# Patient Record
Sex: Male | Born: 1980 | Race: White | Hispanic: No | Marital: Married | Smoking: Never smoker
Health system: Southern US, Community
[De-identification: ages and names within clinical notes are randomized; demographics above are authoritative.]

## PROBLEM LIST (undated history)

## (undated) DIAGNOSIS — J45909 Unspecified asthma, uncomplicated: Secondary | ICD-10-CM

## (undated) DIAGNOSIS — G43909 Migraine, unspecified, not intractable, without status migrainosus: Secondary | ICD-10-CM

## (undated) DIAGNOSIS — N200 Calculus of kidney: Secondary | ICD-10-CM

## (undated) HISTORY — PX: ADENOIDECTOMY: SUR15

## (undated) HISTORY — PX: WISDOM TOOTH EXTRACTION: SHX21

## (undated) HISTORY — PX: TYMPANOSTOMY TUBE PLACEMENT: SHX32

## (undated) HISTORY — PX: TONSILLECTOMY: SUR1361

## (undated) HISTORY — DX: Migraine, unspecified, not intractable, without status migrainosus: G43.909

## (undated) HISTORY — PX: OTHER SURGICAL HISTORY: SHX169

---

## 2005-11-16 ENCOUNTER — Emergency Department: Payer: Self-pay | Admitting: Emergency Medicine

## 2006-02-15 ENCOUNTER — Emergency Department: Payer: Self-pay | Admitting: Emergency Medicine

## 2006-02-15 ENCOUNTER — Other Ambulatory Visit: Payer: Self-pay

## 2006-12-30 ENCOUNTER — Emergency Department: Payer: Self-pay | Admitting: Emergency Medicine

## 2007-03-01 ENCOUNTER — Emergency Department: Payer: Self-pay | Admitting: Emergency Medicine

## 2007-03-10 ENCOUNTER — Emergency Department: Payer: Self-pay | Admitting: Emergency Medicine

## 2007-07-31 ENCOUNTER — Emergency Department: Payer: Self-pay | Admitting: Emergency Medicine

## 2007-07-31 ENCOUNTER — Other Ambulatory Visit: Payer: Self-pay

## 2007-12-01 ENCOUNTER — Emergency Department: Payer: Self-pay | Admitting: Emergency Medicine

## 2007-12-02 ENCOUNTER — Inpatient Hospital Stay (HOSPITAL_COMMUNITY): Admission: EM | Admit: 2007-12-02 | Discharge: 2007-12-02 | Payer: Self-pay | Admitting: Emergency Medicine

## 2007-12-11 ENCOUNTER — Emergency Department: Payer: Self-pay | Admitting: Emergency Medicine

## 2008-09-24 ENCOUNTER — Emergency Department: Payer: Self-pay | Admitting: Emergency Medicine

## 2009-01-22 ENCOUNTER — Emergency Department: Payer: Self-pay | Admitting: Emergency Medicine

## 2009-08-17 ENCOUNTER — Inpatient Hospital Stay: Payer: Self-pay | Admitting: Internal Medicine

## 2009-10-14 ENCOUNTER — Inpatient Hospital Stay: Payer: Self-pay | Admitting: Internal Medicine

## 2011-05-10 LAB — DIFFERENTIAL
Eosinophils Absolute: 0.5
Lymphs Abs: 2.8
Monocytes Absolute: 1.2 — ABNORMAL HIGH
Monocytes Relative: 11
Neutrophils Relative %: 57

## 2011-05-10 LAB — CBC
HCT: 43.7
Hemoglobin: 14.5
MCHC: 33.3
RBC: 5.27
RDW: 14.1

## 2011-05-10 LAB — POCT I-STAT, CHEM 8
Calcium, Ion: 1.18
Chloride: 111
Creatinine, Ser: 1.3
Glucose, Bld: 97
Hemoglobin: 15
Potassium: 3.8

## 2011-05-10 LAB — URINALYSIS, ROUTINE W REFLEX MICROSCOPIC
Hgb urine dipstick: NEGATIVE
Ketones, ur: NEGATIVE
Specific Gravity, Urine: 1.028
pH: 5

## 2013-03-17 ENCOUNTER — Emergency Department: Payer: Self-pay | Admitting: Emergency Medicine

## 2013-05-30 ENCOUNTER — Emergency Department: Payer: Self-pay | Admitting: Emergency Medicine

## 2013-11-11 ENCOUNTER — Emergency Department: Payer: Self-pay | Admitting: Emergency Medicine

## 2014-02-18 ENCOUNTER — Emergency Department: Payer: Self-pay | Admitting: Emergency Medicine

## 2014-02-18 LAB — COMPREHENSIVE METABOLIC PANEL
ALBUMIN: 3.9 g/dL (ref 3.4–5.0)
ALK PHOS: 63 U/L
ALT: 57 U/L (ref 12–78)
ANION GAP: 8 (ref 7–16)
AST: 35 U/L (ref 15–37)
BUN: 16 mg/dL (ref 7–18)
Bilirubin,Total: 0.3 mg/dL (ref 0.2–1.0)
CHLORIDE: 107 mmol/L (ref 98–107)
CREATININE: 1.23 mg/dL (ref 0.60–1.30)
Calcium, Total: 9 mg/dL (ref 8.5–10.1)
Co2: 25 mmol/L (ref 21–32)
EGFR (African American): >60
Glucose: 113 mg/dL — ABNORMAL HIGH (ref 65–99)
Osmolality: 281 (ref 275–301)
POTASSIUM: 3.7 mmol/L (ref 3.5–5.1)
SODIUM: 140 mmol/L (ref 136–145)
Total Protein: 7.8 g/dL (ref 6.4–8.2)

## 2014-02-18 LAB — CBC WITH DIFFERENTIAL/PLATELET
Basophil #: 0.1 10*3/uL (ref 0.0–0.1)
Basophil %: 1.1 %
EOS ABS: 0.2 10*3/uL (ref 0.0–0.7)
Eosinophil %: 2.9 %
HCT: 47.1 % (ref 40.0–52.0)
HGB: 15.4 g/dL (ref 13.0–18.0)
LYMPHS PCT: 42.5 %
Lymphocyte #: 3.6 10*3/uL (ref 1.0–3.6)
MCH: 27.6 pg (ref 26.0–34.0)
MCHC: 32.6 g/dL (ref 32.0–36.0)
MCV: 85 fL (ref 80–100)
MONO ABS: 0.9 x10 3/mm (ref 0.2–1.0)
MONOS PCT: 10.4 %
NEUTROS ABS: 3.6 10*3/uL (ref 1.4–6.5)
NEUTROS PCT: 43.1 %
PLATELETS: 200 10*3/uL (ref 150–440)
RBC: 5.58 10*6/uL (ref 4.40–5.90)
RDW: 13.9 % (ref 11.5–14.5)
WBC: 8.5 10*3/uL (ref 3.8–10.6)

## 2014-06-28 ENCOUNTER — Emergency Department: Payer: Self-pay | Admitting: Emergency Medicine

## 2014-10-04 ENCOUNTER — Emergency Department: Payer: Self-pay | Admitting: Emergency Medicine

## 2014-11-27 ENCOUNTER — Emergency Department
Admit: 2014-11-27 | Disposition: A | Discharge status: Home / Self Care | Payer: Self-pay | Admitting: Emergency Medicine

## 2014-11-27 LAB — COMPREHENSIVE METABOLIC PANEL
ALT: 39 U/L
AST: 30 U/L
Albumin: 4.9 g/dL
Alkaline Phosphatase: 67 U/L
Anion Gap: 11 (ref 7–16)
BILIRUBIN TOTAL: 0.9 mg/dL
BUN: 17 mg/dL
CALCIUM: 9.8 mg/dL
CHLORIDE: 104 mmol/L
CO2: 25 mmol/L
Creatinine: 1.1 mg/dL
EGFR (African American): >60
Glucose: 100 mg/dL — ABNORMAL HIGH
Potassium: 4 mmol/L
Sodium: 140 mmol/L
Total Protein: 8.3 g/dL — ABNORMAL HIGH

## 2014-11-27 LAB — CBC
HCT: 49.5 % (ref 40.0–52.0)
HGB: 16.5 g/dL (ref 13.0–18.0)
MCH: 27.7 pg (ref 26.0–34.0)
MCHC: 33.3 g/dL (ref 32.0–36.0)
MCV: 83 fL (ref 80–100)
Platelet: 153 10*3/uL (ref 150–440)
RBC: 5.94 10*6/uL — ABNORMAL HIGH (ref 4.40–5.90)
RDW: 13.9 % (ref 11.5–14.5)
WBC: 8.3 10*3/uL (ref 3.8–10.6)

## 2014-11-27 LAB — ED INFLUENZA
INFLAPCR: NEGATIVE
Influenza B By PCR: NEGATIVE

## 2014-11-27 LAB — URINALYSIS, COMPLETE
Bacteria: NONE SEEN
Bilirubin,UR: NEGATIVE
Blood: NEGATIVE
Glucose,UR: NEGATIVE mg/dL (ref 0–75)
Ketone: NEGATIVE
LEUKOCYTE ESTERASE: NEGATIVE
NITRITE: NEGATIVE
PROTEIN: NEGATIVE
Ph: 7 (ref 4.5–8.0)
RBC,UR: NONE SEEN /HPF (ref 0–5)
SPECIFIC GRAVITY: 1.023 (ref 1.003–1.030)
Squamous Epithelial: NONE SEEN
WBC UR: NONE SEEN /HPF (ref 0–5)

## 2014-11-27 LAB — LIPASE, BLOOD: LIPASE: 35 U/L

## 2015-01-22 ENCOUNTER — Emergency Department
Admission: EM | Admit: 2015-01-22 | Discharge: 2015-01-22 | Disposition: A | Discharge status: Home / Self Care | Payer: BLUE CROSS/BLUE SHIELD | Attending: Emergency Medicine | Admitting: Emergency Medicine

## 2015-01-22 DIAGNOSIS — K0889 Other specified disorders of teeth and supporting structures: Secondary | ICD-10-CM

## 2015-01-22 DIAGNOSIS — Z880 Allergy status to penicillin: Secondary | ICD-10-CM | POA: Insufficient documentation

## 2015-01-22 DIAGNOSIS — K088 Other specified disorders of teeth and supporting structures: Secondary | ICD-10-CM | POA: Diagnosis present

## 2015-01-22 MED ORDER — OXYCODONE-ACETAMINOPHEN 7.5-325 MG PO TABS: 1.0000 | ORAL_TABLET | Freq: Four times a day (QID) | ORAL | Status: DC | PRN

## 2015-01-22 MED ORDER — OXYCODONE-ACETAMINOPHEN 5-325 MG PO TABS
ORAL_TABLET | ORAL | Status: AC
  Administered 2015-01-22: 2 via ORAL
  Filled 2015-01-22: qty 2

## 2015-01-22 MED ORDER — OXYCODONE-ACETAMINOPHEN 5-325 MG PO TABS
2.0000 | ORAL_TABLET | Freq: Once | ORAL | Status: AC
  Administered 2015-01-22: 2 via ORAL

## 2015-01-22 NOTE — ED Notes (Signed)
Pt had filling 3 weeks ago has been treated for abscess to area, states not better and increased swelling.  Saw pmd today and put on clindamycin but here for pain control.

## 2015-01-22 NOTE — Discharge Instructions (Signed)
Follow up with R.Beckie Salts, DDS

## 2015-01-22 NOTE — ED Notes (Signed)
Patient with no complaints at this time. Respirations even and unlabored. Skin warm/dry. Discharge instructions reviewed with patient at this time. Patient given opportunity to voice concerns/ask questions. Patient discharged at this time and left Emergency Department with steady gait.   

## 2015-01-22 NOTE — ED Provider Notes (Signed)
[facility] Emergency Department Provider Note  ____________________________________________  Time seen: Approximately 8:59 PM  I have reviewed the triage vital signs and the nursing notes.   HISTORY  Chief Complaint Dental Pain    HPI Brian Contreras is a 34 y.o. male patient here today for dental pain secondary to a filling and abscess was treated 2 weeks ago. He stated he went to the dentist today and has a prescription for clindamycin stated the pain did not increased after he left his office. Patient now rates his pain as 7/10 describe it as sharp. Patient has prescription on hand for clindamycin only.  No past medical history on file.  There are no active problems to display for this patient.   No past surgical history on file.  Current Outpatient Rx  Name  Route  Sig  Dispense  Refill  . oxyCODONE-acetaminophen (PERCOCET) 7.5-325 MG per tablet   Oral   Take 1 tablet by mouth every 6 (six) hours as needed for severe pain.   12 tablet   0     Allergies Amoxicillin; Ceclor; Penicillins; and Sulfa antibiotics  No family history on file.  Social History History  Substance Use Topics  . Smoking status: Not on file  . Smokeless tobacco: Not on file  . Alcohol Use: Not on file    Review of Systems Constitutional: No fever/chills Eyes: No visual changes. ENT: Dental pain Cardiovascular: Denies chest pain. Respiratory: Denies shortness of breath. Gastrointestinal: No abdominal pain.  No nausea, no vomiting.  No diarrhea.  No constipation. Genitourinary: Negative for dysuria. Musculoskeletal: Negative for back pain. Skin: Negative for rash. Neurological: Negative for headaches, focal weakness or numbness.  10-point ROS otherwise negative.  ____________________________________________   PHYSICAL EXAM:  VITAL SIGNS: ED Triage Vitals  Enc Vitals Group     BP 01/22/15 2048 155/106 mmHg     Pulse Rate 01/22/15 2048 103     Resp  01/22/15 2048 18     Temp 01/22/15 2048 98.7 F (37.1 C)     Temp Source 01/22/15 2048 Oral     SpO2 01/22/15 2048 97 %     Weight 01/22/15 2048 265 lb (120.203 kg)     Height 01/22/15 2048  (1.981 m)     Head Cir --      Peak Flow --      Pain Score 01/22/15 2048 7     Pain Loc --      Pain Edu? --      Excl. in GC? --    Constitutional: Alert and oriented. Well appearing and in no acute distress. Eyes: Conjunctivae are normal. PERRL. EOMI. Head: Atraumatic. Nose: No congestion/rhinnorhea. Mouth/Throat: Mucous membranes are moist.  Oropharynx non-erythematous. Edema and erythema at tooth #29. Neck: No stridor.  No deformity free nuchal range of motion nontender palpation. Hematological/Lymphatic/Immunilogical: No cervical lymphadenopathy. Cardiovascular: Normal rate, regular rhythm. Grossly normal heart sounds.  Good peripheral circulation. Respiratory: Normal respiratory effort.  No retractions. Lungs CTAB. Gastrointestinal: Soft and nontender. No distention. No abdominal bruits. No CVA tenderness. Musculoskeletal: No lower extremity tenderness nor edema.  No joint effusions. Neurologic:  Normal speech and language. No gross focal neurologic deficits are appreciated. Speech is normal. No gait instability. Skin:  Skin is warm, dry and intact. No rash noted. Psychiatric: Mood and affect are normal. Speech and behavior are normal.  ____________________________________________   LABS (all labs ordered are listed, but only abnormal results are displayed)  Labs Reviewed -  No data to display ____________________________________________  EKG   ____________________________________________  RADIOLOGY   ____________________________________________   PROCEDURES  Procedure(s) performed: None  Critical Care performed: No  ____________________________________________   INITIAL IMPRESSION / ASSESSMENT AND PLAN / ED COURSE  Pertinent labs & imaging results that were  available during my care of the patient were reviewed by me and considered in my medical decision making (see chart for details).  Dental pain secondary to abscess. ____________________________________________   FINAL CLINICAL IMPRESSION(S) / ED DIAGNOSES  Final diagnoses:  Pain, dental      Joni Reining, PA-C 01/22/15 2104  Governor Rooks, MD 01/22/15 2250

## 2015-02-05 ENCOUNTER — Encounter: Payer: Self-pay | Admitting: *Deleted

## 2015-02-05 ENCOUNTER — Emergency Department
Admission: EM | Admit: 2015-02-05 | Discharge: 2015-02-05 | Disposition: A | Discharge status: Home / Self Care | Payer: BLUE CROSS/BLUE SHIELD | Attending: Emergency Medicine | Admitting: Emergency Medicine

## 2015-02-05 ENCOUNTER — Emergency Department: Payer: BLUE CROSS/BLUE SHIELD

## 2015-02-05 DIAGNOSIS — Y998 Other external cause status: Secondary | ICD-10-CM | POA: Diagnosis not present

## 2015-02-05 DIAGNOSIS — W109XXA Fall (on) (from) unspecified stairs and steps, initial encounter: Secondary | ICD-10-CM | POA: Insufficient documentation

## 2015-02-05 DIAGNOSIS — S8391XA Sprain of unspecified site of right knee, initial encounter: Secondary | ICD-10-CM | POA: Insufficient documentation

## 2015-02-05 DIAGNOSIS — Z880 Allergy status to penicillin: Secondary | ICD-10-CM | POA: Insufficient documentation

## 2015-02-05 DIAGNOSIS — Z720 Tobacco use: Secondary | ICD-10-CM | POA: Diagnosis not present

## 2015-02-05 DIAGNOSIS — S8991XA Unspecified injury of right lower leg, initial encounter: Secondary | ICD-10-CM | POA: Diagnosis present

## 2015-02-05 DIAGNOSIS — Y92009 Unspecified place in unspecified non-institutional (private) residence as the place of occurrence of the external cause: Secondary | ICD-10-CM | POA: Insufficient documentation

## 2015-02-05 DIAGNOSIS — Y9389 Activity, other specified: Secondary | ICD-10-CM | POA: Diagnosis not present

## 2015-02-05 DIAGNOSIS — S93601A Unspecified sprain of right foot, initial encounter: Secondary | ICD-10-CM | POA: Diagnosis not present

## 2015-02-05 HISTORY — DX: Unspecified asthma, uncomplicated: J45.909

## 2015-02-05 MED ORDER — TRAMADOL HCL 50 MG PO TABS: 50.0000 mg | ORAL_TABLET | Freq: Four times a day (QID) | ORAL | Status: DC | PRN

## 2015-02-05 MED ORDER — IBUPROFEN 800 MG PO TABS
800.0000 mg | ORAL_TABLET | Freq: Once | ORAL | Status: AC
  Administered 2015-02-05: 800 mg via ORAL

## 2015-02-05 MED ORDER — TRAMADOL HCL 50 MG PO TABS
ORAL_TABLET | ORAL | Status: AC
  Administered 2015-02-05: 50 mg via ORAL
  Filled 2015-02-05: qty 1

## 2015-02-05 MED ORDER — IBUPROFEN 800 MG PO TABS: 800.0000 mg | ORAL_TABLET | Freq: Three times a day (TID) | ORAL | Status: DC | PRN

## 2015-02-05 MED ORDER — IBUPROFEN 800 MG PO TABS
ORAL_TABLET | ORAL | Status: AC
  Administered 2015-02-05: 800 mg via ORAL
  Filled 2015-02-05: qty 1

## 2015-02-05 MED ORDER — TRAMADOL HCL 50 MG PO TABS
50.0000 mg | ORAL_TABLET | Freq: Once | ORAL | Status: AC
  Administered 2015-02-05: 50 mg via ORAL

## 2015-02-05 NOTE — ED Provider Notes (Signed)
[facility] Emergency Department Provider Note  ____________________________________________  Time seen: Approximately 4:20 PM  I have reviewed the triage vital signs and the nursing notes.   HISTORY  Chief Complaint Fall    HPI Brian Contreras is a 34 y.o. male patient complaining or right knee and right foot pain secondary to a fall last night. He states walking down stairs in his home and and slipped causing a hyperextension of his knee and right foot. Patient state increased pain with flexion of the knee and weightbearing. Patient also complaining of increased pain with dorsiflexion of the foot. Patient rates the pain as a 5/10 and described as sharp. Patient states only able to ambulate with support.   Past Medical History  Diagnosis Date  . Asthma     There are no active problems to display for this patient.   History reviewed. No pertinent past surgical history.  Current Outpatient Rx  Name  Route  Sig  Dispense  Refill  . ibuprofen (ADVIL,MOTRIN) 800 MG tablet   Oral   Take 1 tablet (800 mg total) by mouth every 8 (eight) hours as needed for moderate pain.   15 tablet   0   . oxyCODONE-acetaminophen (PERCOCET) 7.5-325 MG per tablet   Oral   Take 1 tablet by mouth every 6 (six) hours as needed for severe pain.   12 tablet   0   . traMADol (ULTRAM) 50 MG tablet   Oral   Take 1 tablet (50 mg total) by mouth every 6 (six) hours as needed for moderate pain.   12 tablet   0     Allergies Amoxicillin; Ceclor; Penicillins; and Sulfa antibiotics  No family history on file.  Social History History  Substance Use Topics  . Smoking status: Current Some Day Smoker  . Smokeless tobacco: Not on file  . Alcohol Use: Yes     Comment: occasional    Review of Systems Constitutional: No fever/chills Eyes: No visual changes. ENT: No sore throat. Cardiovascular: Denies chest pain. Respiratory: Denies shortness of  breath. Gastrointestinal: No abdominal pain.  No nausea, no vomiting.  No diarrhea.  No constipation. Genitourinary: Negative for dysuria. Musculoskeletal: Right knee and right foot pain Skin: Negative for rash. Neurological: Negative for headaches, focal weakness or numbness. 10-point ROS otherwise negative.  ____________________________________________   PHYSICAL EXAM:  VITAL SIGNS: ED Triage Vitals  Enc Vitals Group     BP 02/05/15 1554 129/89 mmHg     Pulse Rate 02/05/15 1554 92     Resp --      Temp 02/05/15 1554 97.6 F (36.4 C)     Temp Source 02/05/15 1554 Oral     SpO2 02/05/15 1554 97 %     Weight 02/05/15 1554 265 lb (120.203 kg)     Height 02/05/15 1554  (1.981 m)     Head Cir --      Peak Flow --      Pain Score 02/05/15 1555 5     Pain Loc --      Pain Edu? --      Excl. in GC? --     Constitutional: Alert and oriented. Well appearing and in no acute distress. Eyes: Conjunctivae are normal. PERRL. EOMI. Head: Atraumatic. Nose: No congestion/rhinnorhea. Mouth/Throat: Mucous membranes are moist.  Oropharynx non-erythematous. Neck: No stridor.  No deformity for nuchal range of motion nontender palpation. Hematological/Lymphatic/Immunilogical: No cervical lymphadenopathy. Cardiovascular: Normal rate, regular rhythm. Grossly normal heart sounds.  Good peripheral circulation. Respiratory: Normal respiratory effort.  No retractions. Lungs CTAB. Gastrointestinal: Soft and nontender. No distention. No abdominal bruits. No CVA tenderness. Musculoskeletal: No lower extremity tenderness nor edema.  No joint effusions. Neurologic:  Normal speech and language. No gross focal neurologic deficits are appreciated. Speech is normal. No gait instability. Skin:  Skin is warm, dry and intact. No rash noted. Psychiatric: Mood and affect are normal. Speech and behavior are normal.  ____________________________________________   LABS (all labs ordered are listed, but only  abnormal results are displayed)  Labs Reviewed - No data to display ____________________________________________  EKG   ____________________________________________  RADIOLOGY  .I, Joni Reining, personally viewed and evaluated these images as part of my medical decision making.   ____No acute findings of the right knee and right foot. ________________________________________   PROCEDURES  Procedure(s) performed: None  Critical Care performed: No  ____________________________________________   INITIAL IMPRESSION / ASSESSMENT AND PLAN / ED COURSE  Pertinent labs & imaging results that were available during my care of the patient were reviewed by me and considered in my medical decision making (see chart for details).  Sprain right knee and foot.__Patient was placed in a knee immobilizer given crutches to help her ambulate for the next 2-3 days. Patient also be given a prescription for naproxen and tramadol. Patient also given a work excuse the next 2 days. Patient advised follow-up with [facility] if there is no improvement _or worsening of his complaint. _________________________________________   FINAL CLINICAL IMPRESSION(S) / ED DIAGNOSES  Final diagnoses:  Sprain of right knee, initial encounter  Sprain of right foot, initial encounter      Joni Reining, PA-C 02/05/15 1737  Myrna Blazer, MD 02/05/15 [id number]

## 2015-02-05 NOTE — ED Notes (Signed)
Pt alert and oriented X4, active, cooperative, pt in NAD. RR even and unlabored, color WNL.  Pt informed to return if any life threatening symptoms occur.   

## 2015-02-05 NOTE — ED Notes (Signed)
Pt missed a step fell and landed on right leg, pt complains of pain in right leg and right knee

## 2015-02-05 NOTE — ED Notes (Signed)
Fall last night and pain to right knee and right ankle. Wrapped on arrival. Pt comes with crutches

## 2015-03-19 ENCOUNTER — Emergency Department
Admission: EM | Admit: 2015-03-19 | Discharge: 2015-03-19 | Disposition: A | Discharge status: Home / Self Care | Payer: BLUE CROSS/BLUE SHIELD | Attending: Emergency Medicine | Admitting: Emergency Medicine

## 2015-03-19 ENCOUNTER — Emergency Department: Payer: BLUE CROSS/BLUE SHIELD

## 2015-03-19 ENCOUNTER — Encounter: Payer: Self-pay | Admitting: *Deleted

## 2015-03-19 DIAGNOSIS — S39012A Strain of muscle, fascia and tendon of lower back, initial encounter: Secondary | ICD-10-CM | POA: Diagnosis not present

## 2015-03-19 DIAGNOSIS — Y998 Other external cause status: Secondary | ICD-10-CM | POA: Insufficient documentation

## 2015-03-19 DIAGNOSIS — Y9389 Activity, other specified: Secondary | ICD-10-CM | POA: Diagnosis not present

## 2015-03-19 DIAGNOSIS — Z720 Tobacco use: Secondary | ICD-10-CM | POA: Diagnosis not present

## 2015-03-19 DIAGNOSIS — S199XXA Unspecified injury of neck, initial encounter: Secondary | ICD-10-CM | POA: Diagnosis present

## 2015-03-19 DIAGNOSIS — Y92410 Unspecified street and highway as the place of occurrence of the external cause: Secondary | ICD-10-CM | POA: Diagnosis not present

## 2015-03-19 DIAGNOSIS — Z880 Allergy status to penicillin: Secondary | ICD-10-CM | POA: Insufficient documentation

## 2015-03-19 DIAGNOSIS — S161XXA Strain of muscle, fascia and tendon at neck level, initial encounter: Secondary | ICD-10-CM | POA: Diagnosis not present

## 2015-03-19 MED ORDER — TRAMADOL HCL 50 MG PO TABS: 50.0000 mg | ORAL_TABLET | Freq: Four times a day (QID) | ORAL | Status: DC | PRN

## 2015-03-19 MED ORDER — IBUPROFEN 800 MG PO TABS: 800.0000 mg | ORAL_TABLET | Freq: Three times a day (TID) | ORAL | Status: DC | PRN

## 2015-03-19 MED ORDER — CYCLOBENZAPRINE HCL 10 MG PO TABS
5.0000 mg | ORAL_TABLET | Freq: Once | ORAL | Status: AC
  Administered 2015-03-19: 5 mg via ORAL
  Filled 2015-03-19: qty 1

## 2015-03-19 MED ORDER — TRAMADOL HCL 50 MG PO TABS
50.0000 mg | ORAL_TABLET | Freq: Once | ORAL | Status: AC
  Administered 2015-03-19: 50 mg via ORAL
  Filled 2015-03-19: qty 1

## 2015-03-19 MED ORDER — CYCLOBENZAPRINE HCL 10 MG PO TABS: 10.0000 mg | ORAL_TABLET | Freq: Three times a day (TID) | ORAL | Status: DC | PRN

## 2015-03-19 MED ORDER — IBUPROFEN 800 MG PO TABS
800.0000 mg | ORAL_TABLET | Freq: Once | ORAL | Status: AC
  Administered 2015-03-19: 800 mg via ORAL
  Filled 2015-03-19: qty 1

## 2015-03-19 NOTE — ED Notes (Signed)
PA at bedside.
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
## 2015-04-14 ENCOUNTER — Encounter: Payer: Self-pay | Admitting: Emergency Medicine

## 2015-04-14 ENCOUNTER — Emergency Department
Admission: EM | Admit: 2015-04-14 | Discharge: 2015-04-14 | Disposition: A | Discharge status: Home / Self Care | Payer: BLUE CROSS/BLUE SHIELD | Attending: Emergency Medicine | Admitting: Emergency Medicine

## 2015-04-14 DIAGNOSIS — Z880 Allergy status to penicillin: Secondary | ICD-10-CM | POA: Insufficient documentation

## 2015-04-14 DIAGNOSIS — R109 Unspecified abdominal pain: Secondary | ICD-10-CM | POA: Insufficient documentation

## 2015-04-14 DIAGNOSIS — Z720 Tobacco use: Secondary | ICD-10-CM | POA: Insufficient documentation

## 2015-04-14 DIAGNOSIS — R112 Nausea with vomiting, unspecified: Secondary | ICD-10-CM | POA: Diagnosis present

## 2015-04-14 DIAGNOSIS — R197 Diarrhea, unspecified: Secondary | ICD-10-CM | POA: Insufficient documentation

## 2015-04-14 LAB — URINALYSIS COMPLETE WITH MICROSCOPIC (ARMC ONLY)
Bacteria, UA: NONE SEEN
Bilirubin Urine: NEGATIVE
Glucose, UA: NEGATIVE mg/dL
HGB URINE DIPSTICK: NEGATIVE
Ketones, ur: NEGATIVE mg/dL
Leukocytes, UA: NEGATIVE
NITRITE: NEGATIVE
Protein, ur: NEGATIVE mg/dL
RBC / HPF: NONE SEEN RBC/hpf (ref 0–5)
SPECIFIC GRAVITY, URINE: 1.023 (ref 1.005–1.030)
SQUAMOUS EPITHELIAL / LPF: NONE SEEN
WBC, UA: NONE SEEN WBC/hpf (ref 0–5)
pH: 5 (ref 5.0–8.0)

## 2015-04-14 LAB — CBC
HEMATOCRIT: 46.8 % (ref 40.0–52.0)
Hemoglobin: 15.7 g/dL (ref 13.0–18.0)
MCH: 28.2 pg (ref 26.0–34.0)
MCHC: 33.6 g/dL (ref 32.0–36.0)
MCV: 84.1 fL (ref 80.0–100.0)
Platelets: 160 10*3/uL (ref 150–440)
RBC: 5.57 MIL/uL (ref 4.40–5.90)
RDW: 14.3 % (ref 11.5–14.5)
WBC: 5.2 10*3/uL (ref 3.8–10.6)

## 2015-04-14 LAB — COMPREHENSIVE METABOLIC PANEL
ALT: 44 U/L (ref 17–63)
AST: 36 U/L (ref 15–41)
Albumin: 4.4 g/dL (ref 3.5–5.0)
Alkaline Phosphatase: 69 U/L (ref 38–126)
Anion gap: 8 (ref 5–15)
BILIRUBIN TOTAL: 1.1 mg/dL (ref 0.3–1.2)
BUN: 13 mg/dL (ref 6–20)
CHLORIDE: 107 mmol/L (ref 101–111)
CO2: 27 mmol/L (ref 22–32)
Calcium: 9.3 mg/dL (ref 8.9–10.3)
Creatinine, Ser: 1.34 mg/dL — ABNORMAL HIGH (ref 0.61–1.24)
Glucose, Bld: 108 mg/dL — ABNORMAL HIGH (ref 65–99)
POTASSIUM: 3.7 mmol/L (ref 3.5–5.1)
Sodium: 142 mmol/L (ref 135–145)
TOTAL PROTEIN: 7.7 g/dL (ref 6.5–8.1)

## 2015-04-14 MED ORDER — METOCLOPRAMIDE HCL 5 MG PO TABS: 5.0000 mg | ORAL_TABLET | Freq: Three times a day (TID) | ORAL | Status: DC

## 2015-04-14 MED ORDER — METRONIDAZOLE 500 MG PO TABS: 500.0000 mg | ORAL_TABLET | Freq: Three times a day (TID) | ORAL | Status: AC

## 2015-04-14 MED ORDER — SODIUM CHLORIDE 0.9 % IV BOLUS (SEPSIS)
1000.0000 mL | Freq: Once | INTRAVENOUS | Status: AC
  Administered 2015-04-14: 1000 mL via INTRAVENOUS

## 2015-04-14 MED ORDER — ONDANSETRON HCL 4 MG/2ML IJ SOLN
4.0000 mg | Freq: Once | INTRAMUSCULAR | Status: AC
  Administered 2015-04-14: 4 mg via INTRAVENOUS
  Filled 2015-04-14: qty 2

## 2015-04-14 MED ORDER — ONDANSETRON 4 MG PO TBDP: 4.0000 mg | ORAL_TABLET | Freq: Three times a day (TID) | ORAL | Status: DC | PRN

## 2015-04-14 MED ORDER — METOCLOPRAMIDE HCL 5 MG/ML IJ SOLN
10.0000 mg | Freq: Once | INTRAMUSCULAR | Status: AC
  Administered 2015-04-14: 10 mg via INTRAVENOUS
  Filled 2015-04-14: qty 2

## 2015-04-14 MED ORDER — METRONIDAZOLE 500 MG PO TABS
500.0000 mg | ORAL_TABLET | Freq: Once | ORAL | Status: AC
  Administered 2015-04-14: 500 mg via ORAL
  Filled 2015-04-14: qty 1
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
## 2015-04-14 NOTE — ED Notes (Signed)
Patient to ED with report of vomiting and diarrhea, reports diarrhea for the last 5 days and vomiting for the last 24 hours.

## 2015-04-14 NOTE — Discharge Instructions (Signed)
Your blood tests overall look good. You have a normal white blood cell count and normal renal function with normal electrolytes. Your urine looks okay, without acute tones, which are usually present when the body is struggling with starvation or fuel supply issues. Take Zofran or Reglan for nausea. Take metronidazole as prescribed for possible intestinal infection. Follow-up with her regular doctor. Return to the emergency department if you feel worse, if you have a fever, or feel other urgent concerns.  Diarrhea Diarrhea is watery poop (stool). It can make you feel weak, tired, thirsty, or give you a dry mouth (signs of dehydration). Watery poop is a sign of another problem, most often an infection. It often lasts 2-3 days. It can last longer if it is a sign of something serious. Take care of yourself as told by your doctor. HOME CARE   Drink 1 cup (8 ounces) of fluid each time you have watery poop.  Do not drink the following fluids:  Those that contain simple sugars (fructose, glucose, galactose, lactose, sucrose, maltose).  Sports drinks.  Fruit juices.  Whole milk products.  Sodas.  Drinks with caffeine (coffee, tea, soda) or alcohol.  Oral rehydration solution may be used if the doctor says it is okay. You may make your own solution. Follow this recipe:   - teaspoon table salt.   teaspoon baking soda.   teaspoon salt substitute containing potassium chloride.  1 tablespoons sugar.  1 liter (34 ounces) of water.  Avoid the following foods:  High fiber foods, such as raw fruits and vegetables.  Nuts, seeds, and whole grain breads and cereals.   Those that are sweetened with sugar alcohols (xylitol, sorbitol, mannitol).  Try eating the following foods:  Starchy foods, such as rice, toast, pasta, low-sugar cereal, oatmeal, baked potatoes, crackers, and bagels.  Bananas.  Applesauce.  Eat probiotic-rich foods, such as yogurt and milk products that are  fermented.  Wash your hands well after each time you have watery poop.  Only take medicine as told by your doctor.  Take a warm bath to help lessen burning or pain from having watery poop. GET HELP RIGHT AWAY IF:   You cannot drink fluids without throwing up (vomiting).  You keep throwing up.  You have blood in your poop, or your poop looks black and tarry.  You do not pee (urinate) in 6-8 hours, or there is only a small amount of very dark pee.  You have belly (abdominal) pain that gets worse or stays in the same spot (localizes).  You are weak, dizzy, confused, or light-headed.  You have a very bad headache.  Your watery poop gets worse or does not get better.  You have a fever or lasting symptoms for more than 2-3 days.  You have a fever and your symptoms suddenly get worse. MAKE SURE YOU:   Understand these instructions.  Will watch your condition.  Will get help right away if you are not doing well or get worse. Document Released: 01/18/2008 Document Revised: 12/16/2013 Document Reviewed: 04/08/2012 [facility] Patient Information 2015 [place], [place]. This information is not intended to replace advice given to you by your health care provider. Make sure you discuss any questions you have with your health care provider.

## 2015-04-26 ENCOUNTER — Encounter: Payer: Self-pay | Admitting: Emergency Medicine

## 2015-04-26 ENCOUNTER — Emergency Department
Admission: EM | Admit: 2015-04-26 | Discharge: 2015-04-26 | Disposition: A | Discharge status: Home / Self Care | Payer: BLUE CROSS/BLUE SHIELD | Attending: Emergency Medicine | Admitting: Emergency Medicine

## 2015-04-26 DIAGNOSIS — Z792 Long term (current) use of antibiotics: Secondary | ICD-10-CM | POA: Insufficient documentation

## 2015-04-26 DIAGNOSIS — Z79899 Other long term (current) drug therapy: Secondary | ICD-10-CM | POA: Insufficient documentation

## 2015-04-26 DIAGNOSIS — Z720 Tobacco use: Secondary | ICD-10-CM | POA: Insufficient documentation

## 2015-04-26 DIAGNOSIS — M545 Low back pain: Secondary | ICD-10-CM | POA: Diagnosis present

## 2015-04-26 DIAGNOSIS — M62830 Muscle spasm of back: Secondary | ICD-10-CM | POA: Insufficient documentation

## 2015-04-26 DIAGNOSIS — Z880 Allergy status to penicillin: Secondary | ICD-10-CM | POA: Diagnosis not present

## 2015-04-26 MED ORDER — KETOROLAC TROMETHAMINE 60 MG/2ML IM SOLN
60.0000 mg | Freq: Once | INTRAMUSCULAR | Status: AC
  Administered 2015-04-26: 60 mg via INTRAMUSCULAR
  Filled 2015-04-26: qty 2

## 2015-04-26 MED ORDER — ORPHENADRINE CITRATE 30 MG/ML IJ SOLN
60.0000 mg | Freq: Two times a day (BID) | INTRAMUSCULAR | Status: DC
  Administered 2015-04-26: 60 mg via INTRAMUSCULAR
  Filled 2015-04-26: qty 2

## 2015-04-26 MED ORDER — METHOCARBAMOL 750 MG PO TABS: 1500.0000 mg | ORAL_TABLET | Freq: Four times a day (QID) | ORAL | Status: DC
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
## 2015-08-21 ENCOUNTER — Encounter: Payer: Self-pay | Admitting: Unknown Physician Specialty

## 2015-09-08 ENCOUNTER — Encounter: Payer: Self-pay | Admitting: Unknown Physician Specialty

## 2016-03-03 ENCOUNTER — Encounter: Payer: Self-pay | Admitting: Emergency Medicine

## 2016-03-03 ENCOUNTER — Emergency Department
Admission: EM | Admit: 2016-03-03 | Discharge: 2016-03-03 | Disposition: A | Discharge status: Home / Self Care | Payer: 59 | Attending: Emergency Medicine | Admitting: Emergency Medicine

## 2016-03-03 DIAGNOSIS — X58XXXA Exposure to other specified factors, initial encounter: Secondary | ICD-10-CM | POA: Insufficient documentation

## 2016-03-03 DIAGNOSIS — Y999 Unspecified external cause status: Secondary | ICD-10-CM | POA: Insufficient documentation

## 2016-03-03 DIAGNOSIS — K0381 Cracked tooth: Secondary | ICD-10-CM

## 2016-03-03 DIAGNOSIS — Y929 Unspecified place or not applicable: Secondary | ICD-10-CM | POA: Diagnosis not present

## 2016-03-03 DIAGNOSIS — S025XXA Fracture of tooth (traumatic), initial encounter for closed fracture: Secondary | ICD-10-CM | POA: Diagnosis not present

## 2016-03-03 DIAGNOSIS — J45909 Unspecified asthma, uncomplicated: Secondary | ICD-10-CM | POA: Insufficient documentation

## 2016-03-03 DIAGNOSIS — F17200 Nicotine dependence, unspecified, uncomplicated: Secondary | ICD-10-CM | POA: Insufficient documentation

## 2016-03-03 DIAGNOSIS — K029 Dental caries, unspecified: Secondary | ICD-10-CM

## 2016-03-03 DIAGNOSIS — Y939 Activity, unspecified: Secondary | ICD-10-CM | POA: Insufficient documentation

## 2016-03-03 DIAGNOSIS — Z79899 Other long term (current) drug therapy: Secondary | ICD-10-CM | POA: Insufficient documentation

## 2016-03-03 DIAGNOSIS — K0889 Other specified disorders of teeth and supporting structures: Secondary | ICD-10-CM | POA: Diagnosis present

## 2016-03-03 MED ORDER — LIDOCAINE-EPINEPHRINE 2 %-1:100000 IJ SOLN
1.7000 mL | Freq: Once | INTRAMUSCULAR | Status: AC
  Administered 2016-03-03: 1.7 mL
  Filled 2016-03-03: qty 1.7

## 2016-03-03 MED ORDER — TRAMADOL HCL 50 MG PO TABS: 50.0000 mg | ORAL_TABLET | Freq: Two times a day (BID) | ORAL | Status: DC

## 2016-03-03 MED ORDER — CLINDAMYCIN HCL 300 MG PO CAPS: 300.0000 mg | ORAL_CAPSULE | Freq: Three times a day (TID) | ORAL | Status: DC

## 2016-03-03 MED ORDER — CLINDAMYCIN HCL 150 MG PO CAPS
300.0000 mg | ORAL_CAPSULE | Freq: Once | ORAL | Status: AC
  Administered 2016-03-03: 300 mg via ORAL
  Filled 2016-03-03: qty 2

## 2016-03-03 NOTE — ED Notes (Signed)
States he has been having some dental work done .  But states he broke a tooth to right upper couple of days ago  Having increased pain with min swelling

## 2016-03-03 NOTE — ED Provider Notes (Signed)
[facility] Emergency Department Provider Note ____________________________________________  Time seen: 1952  I have reviewed the triage vital signs and the nursing notes.  HISTORY  Chief Complaint  Dental Pain  HPI Brian Contreras is a 35 y.o. male is to the ED for evaluation of acute dental pain to the right upper jaw after a recently fractured tooth. He describes of breaking of the right upper premolar about to 3 days prior. He does note underlying dental caries as the cause. He has been evaluated and treated over the last 6 months with serial extractions by Dr. Hedda Slade. He presents with stating he is not able to see the dental provider until next week due to financial constraints. He denies any current fevers, chills, sweats. He does note increased pain and throbbing to the right upper jaw.  Past Medical History  Diagnosis Date  . Asthma     There are no active problems to display for this patient.   Past Surgical History  Procedure Laterality Date  . Tympanostomy tube placement    . Tonsillectomy    . Arm surgery Bilateral     Current Outpatient Rx  Name  Route  Sig  Dispense  Refill  . clindamycin (CLEOCIN) 300 MG capsule   Oral   Take 1 capsule (300 mg total) by mouth 3 (three) times daily.   30 capsule   0   . cyclobenzaprine (FLEXERIL) 10 MG tablet   Oral   Take 1 tablet (10 mg total) by mouth every 8 (eight) hours as needed for muscle spasms.   15 tablet   0   . ibuprofen (ADVIL,MOTRIN) 800 MG tablet   Oral   Take 1 tablet (800 mg total) by mouth every 8 (eight) hours as needed for moderate pain.   15 tablet   0   . ibuprofen (ADVIL,MOTRIN) 800 MG tablet   Oral   Take 1 tablet (800 mg total) by mouth every 8 (eight) hours as needed for moderate pain.   15 tablet   0   . methocarbamol (ROBAXIN-750) 750 MG tablet   Oral   Take 2 tablets (1,500 mg total) by mouth 4 (four) times daily.   40 tablet   0   .  metoCLOPramide (REGLAN) 5 MG tablet   Oral   Take 1 tablet (5 mg total) by mouth 3 (three) times daily.   15 tablet   0   . ondansetron (ZOFRAN ODT) 4 MG disintegrating tablet   Oral   Take 1 tablet (4 mg total) by mouth every 8 (eight) hours as needed for nausea or vomiting.   10 tablet   0   . SUMAtriptan (IMITREX) 100 MG tablet   Oral   Take 100 mg by mouth every 2 (two) hours as needed for migraine. May repeat in 2 hours if headache persists or recurs.         . traMADol (ULTRAM) 50 MG tablet   Oral   Take 1 tablet (50 mg total) by mouth 2 (two) times daily.   10 tablet   0     Allergies Amoxicillin; Ceclor; Penicillins; and Sulfa antibiotics  Family History  Problem Relation Age of Onset  . Arthritis Mother   . Diabetes Mother   . Kidney disease Mother   . Migraines Mother   . Arthritis Father   . Arthritis Brother     Social History Social History  Substance Use Topics  . Smoking status: Current Some Day  Smoker  . Smokeless tobacco: None  . Alcohol Use: Yes     Comment: occasional    Review of Systems  Constitutional: Negative for fever. Eyes: Negative for visual changes. ENT: Negative for sore throat. Dental pain as above. Gastrointestinal: Negative for abdominal pain, vomiting and diarrhea. Neurological: Negative for headaches, focal weakness or numbness. ____________________________________________  PHYSICAL EXAM:  VITAL SIGNS: ED Triage Vitals  Enc Vitals Group     BP 03/03/16 1844 134/96 mmHg     Pulse Rate 03/03/16 1844 99     Resp 03/03/16 1844 18     Temp 03/03/16 1844 98.1 F (36.7 C)     Temp Source 03/03/16 1844 Oral     SpO2 03/03/16 1844 98 %     Weight 03/03/16 1844 265 lb (120.203 kg)     Height 03/03/16 1844 6\' 6"  (1.981 m)     Head Cir --      Peak Flow --      Pain Score --      Pain Loc --      Pain Edu? --      Excl. in GC? --     Constitutional: Alert and oriented. Well appearing and in no distress. Head:  Normocephalic and atraumatic. Mouth/Throat: Mucous membranes are moist. Uvula is midline. No buccal edema, pointing, or abscess formation noted. Right upper pre-molar #5 with fracture and partial almalgam in place. No TMJ pain or crepitus.  Neck: Supple. No thyromegaly. Hematological/Lymphatic/Immunological: No cervical lymphadenopathy. Cardiovascular: Normal rate, regular rhythm.  Respiratory: Normal respiratory effort. No wheezes/rales/rhonchi. Neurologic:  Normal gait without ataxia. Normal speech and language. No gross focal neurologic deficits are appreciated. Skin:  Skin is warm, dry and intact. No rash noted. ____________________________________________  PROCEDURES  Clindamycin 300 mg PO  DENTAL BLOCK  Performed by: Lissa HoardMenshew, Ruel Dimmick V Bacon Consent: Verbal consent obtained. Required items: devices and special equipment available Time out: Immediately prior to procedure a "time out" was called to verify the correct patient, procedure, equipment, support staff and site/side marked as required.  Indication: pain Nerve block body site: Right upper premolar #5  Preparation: Patient was prepped and draped in the usual sterile fashion. Needle gauge: 27 G Location technique: anatomical landmarks  Local anesthetic: lidocaine-EPINEPHrine (XYLOCAINE W/EPI) 2 %-1:100000   Anesthetic total: 1 ml  Outcome: pain improved Patient tolerance: Patient tolerated the procedure well with no immediate complications. ____________________________________________  INITIAL IMPRESSION / ASSESSMENT AND PLAN / ED COURSE  Patient with recent dental fracture due to caries. He is discharged with pain relieved by dental block. He is started on clindamycin empirically. He will follow-up with the dental surgeon next week as planned. A prescription for Ultram #10 is provided as well.  ____________________________________________  FINAL CLINICAL IMPRESSION(S) / ED DIAGNOSES  Final diagnoses:  Pain due to  dental caries  Broken or cracked tooth, nontraumatic     Lissa HoardJenise V Bacon Kayli Beal, PA-C 03/03/16 2020  Jeanmarie PlantJames A McShane, MD 03/03/16 [id number]

## 2016-03-03 NOTE — ED Notes (Signed)
Chipped tooth in upper right jaw.  C/O pain.  Has appointment with dentist next week.

## 2016-03-03 NOTE — Discharge Instructions (Signed)
Take the antibiotic as directed and the pain medicine as needed. Follow-up with Dr. Montez Moritaarter for dental extraction as planned.   Dental Caries Dental caries is tooth decay. This decay can cause a hole in teeth (cavity) that can get bigger and deeper over time. HOME CARE  Brush and floss your teeth. Do this at least two times a day.  Use a fluoride toothpaste.  Use a mouth rinse if told by your dentist or doctor.  Eat less sugary and starchy foods. Drink less sugary drinks.  Avoid snacking often on sugary and starchy foods. Avoid sipping often on sugary drinks.  Keep regular checkups and cleanings with your dentist.  Use fluoride supplements if told by your dentist or doctor.  Allow fluoride to be applied to teeth if told by your dentist or doctor.   This information is not intended to replace advice given to you by your health care provider. Make sure you discuss any questions you have with your health care provider.   Document Released: 05/10/2008 Document Revised: 08/22/2014 Document Reviewed: 08/03/2012 Elsevier Interactive Patient Education 2016 [employer]  Dental Pain Dental pain may be caused by many things, including:  Tooth decay (cavities or caries). Cavities cause the nerve of your tooth to be open to air and hot or cold temperatures. This can cause pain or discomfort.  Abscess or infection. A dental abscess is an area that is full of infected pus from a bacterial infection in the inner part of the tooth (pulp). It usually happens at the end of the tooth's root.  Injury.  An unknown reason (idiopathic). Your pain may be mild or severe. It may only happen when:  You are chewing.  You are exposed to hot or cold temperature.  You are eating or drinking sugary foods or beverages, such as:  Soda.  Candy. Your pain may also be there all of the time. HOME CARE Watch your dental pain for any changes. Do these things to lessen your discomfort:  Take medicines  only as told by your dentist.  If your dentist tells you to take an antibiotic medicine, finish all of it even if you start to feel better.  Keep all follow-up visits as told by your dentist. This is important.  Do not apply heat to the outside of your face.  Rinse your mouth or gargle with salt water if told by your dentist. This helps with pain and swelling.  You can make salt water by adding  tsp of salt to 1 cup of warm water.  Apply ice to the painful area of your face:  Put ice in a plastic bag.  Place a towel between your skin and the bag.  Leave the ice on for 20 minutes, 2-3 times per day.  Avoid foods or drinks that cause you pain, such as:  Very hot or very cold foods or drinks.  Sweet or sugary foods or drinks. GET HELP IF:  Your pain is not helped with medicines.  Your symptoms are worse.  You have new symptoms. GET HELP RIGHT AWAY IF:  You cannot open your mouth.  You are having trouble breathing or swallowing.  You have a fever.  Your face, neck, or jaw is puffy (swollen).   This information is not intended to replace advice given to you by your health care provider. Make sure you discuss any questions you have with your health care provider.   Document Released: 01/18/2008 Document Revised: 12/16/2014 Document Reviewed: 07/28/2014 Elsevier Interactive  Patient Education 2016 [employer].

## 2016-03-16 ENCOUNTER — Emergency Department: Payer: 59

## 2016-03-16 ENCOUNTER — Encounter: Payer: Self-pay | Admitting: *Deleted

## 2016-03-16 ENCOUNTER — Emergency Department
Admission: EM | Admit: 2016-03-16 | Discharge: 2016-03-16 | Disposition: A | Discharge status: Home / Self Care | Payer: 59 | Attending: Emergency Medicine | Admitting: Emergency Medicine

## 2016-03-16 DIAGNOSIS — F17200 Nicotine dependence, unspecified, uncomplicated: Secondary | ICD-10-CM | POA: Insufficient documentation

## 2016-03-16 DIAGNOSIS — S300XXA Contusion of lower back and pelvis, initial encounter: Secondary | ICD-10-CM

## 2016-03-16 DIAGNOSIS — J45909 Unspecified asthma, uncomplicated: Secondary | ICD-10-CM | POA: Diagnosis not present

## 2016-03-16 DIAGNOSIS — Y929 Unspecified place or not applicable: Secondary | ICD-10-CM | POA: Diagnosis not present

## 2016-03-16 DIAGNOSIS — Y999 Unspecified external cause status: Secondary | ICD-10-CM | POA: Insufficient documentation

## 2016-03-16 DIAGNOSIS — W1839XA Other fall on same level, initial encounter: Secondary | ICD-10-CM | POA: Insufficient documentation

## 2016-03-16 DIAGNOSIS — Y9389 Activity, other specified: Secondary | ICD-10-CM | POA: Diagnosis not present

## 2016-03-16 DIAGNOSIS — K0889 Other specified disorders of teeth and supporting structures: Secondary | ICD-10-CM | POA: Insufficient documentation

## 2016-03-16 DIAGNOSIS — M533 Sacrococcygeal disorders, not elsewhere classified: Secondary | ICD-10-CM | POA: Diagnosis present

## 2016-03-16 DIAGNOSIS — Z9622 Myringotomy tube(s) status: Secondary | ICD-10-CM | POA: Insufficient documentation

## 2016-03-16 DIAGNOSIS — W19XXXA Unspecified fall, initial encounter: Secondary | ICD-10-CM

## 2016-03-16 MED ORDER — MAGIC MOUTHWASH W/LIDOCAINE: 5.0000 mL | Freq: Four times a day (QID) | ORAL | 0 refills | Status: DC

## 2016-03-16 MED ORDER — MELOXICAM 15 MG PO TABS: 15.0000 mg | ORAL_TABLET | Freq: Every day | ORAL | 0 refills | Status: DC

## 2016-03-16 NOTE — ED Provider Notes (Signed)
[facility] Emergency Department Provider Note  ____________________________________________  Time seen: Approximately 3:36 PM  I have reviewed the triage vital signs and the nursing notes.   HISTORY  Chief Complaint Fall    HPI Brian Contreras is a 35 y.o. male who presents emergency department complaining of sacrum pain. Patient states that he was sighing and one of his rifles on the range, and he went to sit down, lost his balance and landed on a sharp rock. Patient reports that initially he felt the area was bruised but over the intervening. He has had increasing pain to the region. He denies any other injury or complaint. He did not hit his head. Patient denies any numbness or tingling in lower extremities. No bowel or bladder dysfunction. No saddle anesthesia. No paresthesias.  Patient also had a dental extraction last week and is complaining of continued pain to the area. Patient reports that he is concerned that he may have a dry socket. He finished the antibiotics prescribed for him. He denies any difficulty breathing or swallowing. He denies any fevers or chills. No other complaints at this time.   Past Medical History:  Diagnosis Date  . Asthma     There are no active problems to display for this patient.   Past Surgical History:  Procedure Laterality Date  . arm surgery Bilateral   . TONSILLECTOMY    . TYMPANOSTOMY TUBE PLACEMENT      Prior to Admission medications   Medication Sig Start Date End Date Taking? Authorizing Provider  magic mouthwash w/lidocaine SOLN Take 5 mLs by mouth 4 (four) times daily. 03/16/16   Delorise Royals Cuthriell, PA-C  meloxicam (MOBIC) 15 MG tablet Take 1 tablet (15 mg total) by mouth daily. 03/16/16   Delorise Royals Cuthriell, PA-C  SUMAtriptan (IMITREX) 100 MG tablet Take 100 mg by mouth every 2 (two) hours as needed for migraine. May repeat in 2 hours if headache persists or recurs.    Historical Provider, MD     Allergies Amoxicillin; Ceclor [cefaclor]; Penicillins; and Sulfa antibiotics  Family History  Problem Relation Age of Onset  . Arthritis Mother   . Diabetes Mother   . Kidney disease Mother   . Migraines Mother   . Arthritis Father   . Arthritis Brother     Social History Social History  Substance Use Topics  . Smoking status: Current Some Day Smoker  . Smokeless tobacco: Not on file  . Alcohol use Yes     Comment: occasional     Review of Systems  Constitutional: No fever/chills Eyes: No visual changes. No discharge ENT: Complaining of right upper dental pain status post dental extraction. Cardiovascular: no chest pain. Respiratory: no cough. No SOB. Gastrointestinal: No abdominal pain.  No nausea, no vomiting. Genitourinary: Negative for dysuria. No hematuria Musculoskeletal: Positive for lumbosacral pain. Skin: Negative for rash, abrasions, lacerations, ecchymosis. Neurological: Negative for headaches, focal weakness or numbness. 10-point ROS otherwise negative.  ____________________________________________   PHYSICAL EXAM:  VITAL SIGNS: ED Triage Vitals  Enc Vitals Group     BP 03/16/16 1528 (!) 140/104     Pulse Rate 03/16/16 1528 (!) 108     Resp 03/16/16 1528 18     Temp 03/16/16 1528 97.5 F (36.4 C)     Temp Source 03/16/16 1528 Oral     SpO2 03/16/16 1528 96 %     Weight 03/16/16 1528 265 lb (120.2 kg)     Height 03/16/16 1528 6\' 6"  (  1.981 m)     Head Circumference --      Peak Flow --      Pain Score 03/16/16 1529 5     Pain Loc --      Pain Edu? --      Excl. in GC? --      Constitutional: Alert and oriented. Well appearing and in no acute distress. Eyes: Conjunctivae are normal. PERRL. EOMI. Head: Atraumatic. ENT:      Ears:       Nose: No congestion/rhinnorhea.      Mouth/Throat: Mucous membranes are moist. Dental extraction is noted to tooth #4. No surrounding erythema or edema. No signs of dry socket. Neck: No stridor.     Cardiovascular: Normal rate, regular rhythm. Normal S1 and S2.  Good peripheral circulation. Respiratory: Normal respiratory effort without tachypnea or retractions. Lungs CTAB. Good air entry to the bases with no decreased or absent breath sounds. Musculoskeletal: Full range of motion to all extremities. No gross deformities appreciated. No ecchymosis, contusions, abrasions noted to the lumbar sacral region. Patient is very tender to palpation over the lower lumbar and sacral spine region. No palpable abnormality. Dorsalis pedis pulse intact bilateral lower extremities. Sensation intact and equal lower extremities. Neurologic:  Normal speech and language. No gross focal neurologic deficits are appreciated.  Skin:  Skin is warm, dry and intact. No rash noted. Psychiatric: Mood and affect are normal. Speech and behavior are normal. Patient exhibits appropriate insight and judgement.   ____________________________________________   LABS (all labs ordered are listed, but only abnormal results are displayed)  Labs Reviewed - No data to display ____________________________________________  EKG   ____________________________________________  RADIOLOGY Festus Barren Cuthriell, personally viewed and evaluated these images (plain radiographs) as part of my medical decision making, as well as reviewing the written report by the radiologist.  Dg Lumbar Spine Complete  Result Date: 03/16/2016 CLINICAL DATA:  35 year old male who fell onto a rock yesterday. Constant tail bone pain. Initial encounter. EXAM: LUMBAR SPINE - COMPLETE 4+ VIEW COMPARISON:  Lumbar radiographs 03/19/2015. FINDINGS: Bone mineralization is within normal limits. Normal lumbar segmentation. Chronic straightening of lumbar lordosis with otherwise preserved vertebral height and alignment. Relatively preserved disc spaces. Visible lower thoracic levels appear intact. No pars fracture. Sacral ala and SI joints appear stable and  intact. IMPRESSION: Stable and negative radiographic appearance of the lumbar spine. Electronically Signed   By: Odessa Fleming M.D.   On: 03/16/2016 16:16   Dg Sacrum/coccyx  Result Date: 03/16/2016 CLINICAL DATA:  35 year old male who fell onto a rock yesterday. Constant tail bone pain. Initial encounter. EXAM: SACRUM AND COCCYX - 2+ VIEW COMPARISON:  Lumbar radiographs from today, and 03/19/2015. FINDINGS: Bone mineralization is within normal limits. Sacral ala appear intact. SI joints appear normal. On the lateral view sacral and coccygeal segments appear intact and within normal limits. Visible bony pelvis intact. IMPRESSION: No acute fracture or dislocation identified about the sacrum or coccyx. Electronically Signed   By: Odessa Fleming M.D.   On: 03/16/2016 16:17    ____________________________________________    PROCEDURES  Procedure(s) performed:    Procedures    Medications - No data to display   ____________________________________________   INITIAL IMPRESSION / ASSESSMENT AND PLAN / ED COURSE  Pertinent labs & imaging results that were available during my care of the patient were reviewed by me and considered in my medical decision making (see chart for details).  Clinical Course    Patient's diagnosis is  consistent with A fall resulting in contusion of the coccyx. Patient's exam is reassuring. X-ray reveals no acute osseous abnormality. Patient also endorsed some socket pain from dental extraction. No signs of dry socket.. Patient will be discharged home with prescriptions for anti-inflammatories for coccyx pain and magic mouthwash for dental pain status post extraction. Patient is to follow up with primary care provider as needed or otherwise directed. Patient is given ED precautions to return to the ED for any worsening or new symptoms.     ____________________________________________  FINAL CLINICAL IMPRESSION(S) / ED DIAGNOSES  Final diagnoses:  Coccyx contusion,  initial encounter  Fall, initial encounter  Pain, dental      NEW MEDICATIONS STARTED DURING THIS VISIT:  New Prescriptions   MAGIC MOUTHWASH W/LIDOCAINE SOLN    Take 5 mLs by mouth 4 (four) times daily.   MELOXICAM (MOBIC) 15 MG TABLET    Take 1 tablet (15 mg total) by mouth daily.        This chart was dictated using voice recognition software/Dragon. Despite best efforts to proofread, errors can occur which can change the meaning. Any change was purely unintentional.    Racheal Patches, PA-C 03/16/16 1628    Phineas Semen, MD 03/16/16 [phone]

## 2016-03-16 NOTE — ED Notes (Signed)
See triage note   States he fell yesterday landed on tailbone  Having increased pain today

## 2016-03-16 NOTE — ED Triage Notes (Signed)
States he fell yesterday and is now having some lower back pain and tailbone pain, states a rock went straight up his sacrum

## 2016-05-03 ENCOUNTER — Emergency Department
Admission: EM | Admit: 2016-05-03 | Discharge: 2016-05-03 | Disposition: A | Discharge status: Home / Self Care | Payer: 59 | Attending: Emergency Medicine | Admitting: Emergency Medicine

## 2016-05-03 ENCOUNTER — Encounter: Payer: Self-pay | Admitting: Emergency Medicine

## 2016-05-03 DIAGNOSIS — F17200 Nicotine dependence, unspecified, uncomplicated: Secondary | ICD-10-CM | POA: Diagnosis not present

## 2016-05-03 DIAGNOSIS — J45909 Unspecified asthma, uncomplicated: Secondary | ICD-10-CM | POA: Diagnosis not present

## 2016-05-03 DIAGNOSIS — K047 Periapical abscess without sinus: Secondary | ICD-10-CM | POA: Insufficient documentation

## 2016-05-03 DIAGNOSIS — K0889 Other specified disorders of teeth and supporting structures: Secondary | ICD-10-CM | POA: Diagnosis present

## 2016-05-03 MED ORDER — LIDOCAINE-EPINEPHRINE 2 %-1:100000 IJ SOLN
INTRAMUSCULAR | Status: AC
  Filled 2016-05-03: qty 1.7

## 2016-05-03 MED ORDER — LIDOCAINE VISCOUS 2 % MT SOLN: 20.0000 mL | OROMUCOSAL | 0 refills | Status: DC | PRN

## 2016-05-03 MED ORDER — LIDOCAINE-EPINEPHRINE 2 %-1:100000 IJ SOLN: 1.7000 mL | Freq: Once | INTRAMUSCULAR | Status: DC

## 2016-05-03 MED ORDER — CLINDAMYCIN HCL 300 MG PO CAPS: 300.0000 mg | ORAL_CAPSULE | Freq: Three times a day (TID) | ORAL | 0 refills | Status: AC

## 2016-05-03 MED ORDER — NAPROXEN 500 MG PO TABS: 500.0000 mg | ORAL_TABLET | Freq: Two times a day (BID) | ORAL | 0 refills | Status: DC

## 2016-05-03 NOTE — ED Provider Notes (Signed)
[facility] Emergency Department Provider Note ____________________________________________  Time seen: Approximately 11:14 AM  I have reviewed the triage vital signs and the nursing notes.   HISTORY  Chief Complaint Dental Pain   HPI Brian Contreras is a 35 y.o. male who presents to the emergency department for evaluation of dental pain. Symptoms started 2 days ago and is worsening. No relief with ibuprofen. He feels that his gums are swelling over the chronically fractured teeth on the top left. He plans on [facility] as soon as he gets paid. He denies fever.  Past Medical History:  Diagnosis Date  . Asthma     There are no active problems to display for this patient.   Past Surgical History:  Procedure Laterality Date  . arm surgery Bilateral   . TONSILLECTOMY    . TYMPANOSTOMY TUBE PLACEMENT      Prior to Admission medications   Medication Sig Start Date End Date Taking? Authorizing Provider  clindamycin (CLEOCIN) 300 MG capsule Take 1 capsule (300 mg total) by mouth 3 (three) times daily. 05/03/16 05/13/16  Chinita Pesterari B Hend Mccarrell, FNP  lidocaine (XYLOCAINE) 2 % solution Use as directed 20 mLs in the mouth or throat as needed for mouth pain. 05/03/16   Chinita Pesterari B Glennie Rodda, FNP  meloxicam (MOBIC) 15 MG tablet Take 1 tablet (15 mg total) by mouth daily. 03/16/16   Delorise RoyalsJonathan D Cuthriell, PA-C  naproxen (NAPROSYN) 500 MG tablet Take 1 tablet (500 mg total) by mouth 2 (two) times daily with a meal. 05/03/16   Timberlyn Pickford B Zanyah Lentsch, FNP  SUMAtriptan (IMITREX) 100 MG tablet Take 100 mg by mouth every 2 (two) hours as needed for migraine. May repeat in 2 hours if headache persists or recurs.    Historical Provider, MD    Allergies Amoxicillin; Ceclor [cefaclor]; Penicillins; and Sulfa antibiotics  Family History  Problem Relation Age of Onset  . Arthritis Mother   . Diabetes Mother   . Kidney disease Mother   . Migraines Mother   . Arthritis  Father   . Arthritis Brother     Social History Social History  Substance Use Topics  . Smoking status: Current Some Day Smoker  . Smokeless tobacco: Not on file  . Alcohol use Yes     Comment: occasional    Review of Systems Constitutional: Uncomfortable appearing ENT: Positive for dental pain Musculoskeletal: Negative for jaw pain/trismus. Skin: Positive for swelling of the gum on the upper left. ____________________________________________   PHYSICAL EXAM:  VITAL SIGNS: ED Triage Vitals  Enc Vitals Group     BP 05/03/16 1043 (!) 157/107     Pulse Rate 05/03/16 1043 93     Resp 05/03/16 1043 20     Temp 05/03/16 1043 97.8 F (36.6 C)     Temp Source 05/03/16 1043 Oral     SpO2 05/03/16 1043 96 %     Weight 05/03/16 1044 265 lb (120.2 kg)     Height 05/03/16 1044 6\' 6"  (1.981 m)     Head Circumference --      Peak Flow --      Pain Score 05/03/16 1105 8     Pain Loc --      Pain Edu? --      Excl. in GC? --     Constitutional: Alert and oriented. Well appearing and in no acute distress. Eyes: Conjunctivae are normal.EOMI. Mouth/Throat: Mucous membranes are moist. Oropharynx non-erythematous. Periodontal Exam  Hematological/Lymphatic/Immunilogical: No cervical lymphadenopathy. Respiratory: Normal respiratory effort.  Musculoskeletal: Full ROM x 4 extremities. Neurologic:  Normal speech and language. No gross focal neurologic deficits are appreciated. Speech is normal. No gait instability. Skin:  No swelling of the face Psychiatric: Mood and affect are normal. Speech and behavior are normal.  ____________________________________________   LABS (all labs ordered are listed, but only abnormal results are displayed)  Labs Reviewed - No data to display ____________________________________________   RADIOLOGY  Not indicated. ____________________________________________   PROCEDURES  Procedure(s) performed: Dental block--inferior alveolar block on  the left  Critical Care performed: No  ____________________________________________   INITIAL IMPRESSION / ASSESSMENT AND PLAN / ED COURSE  Pertinent labs & imaging results that were available during my care of the patient were reviewed by me and considered in my medical decision making (see chart for details). Patient will be prescribed Amoxicillin and naprosyn. Patient was advised to see the dentist within 14 days. Also advised to take the antibiotic until finished. Instructed to return to the ER for symptoms that change or worsen if unable to schedule an appointment. ____________________________________________   FINAL CLINICAL IMPRESSION(S) / ED DIAGNOSES  Final diagnoses:  Dental infection    Note:  This document was prepared using Dragon voice recognition software and may include unintentional dictation errors.    Chinita Pester, FNP 05/03/16 1304    Jeanmarie Plant, MD 05/03/16 [phone]

## 2016-05-03 NOTE — ED Triage Notes (Signed)
Pt c/o upper back dental pain where teeth have broken off. Going Friday to get fixed.

## 2016-07-16 ENCOUNTER — Emergency Department
Admission: EM | Admit: 2016-07-16 | Discharge: 2016-07-16 | Disposition: A | Discharge status: Home / Self Care | Payer: 59 | Attending: Emergency Medicine | Admitting: Emergency Medicine

## 2016-07-16 ENCOUNTER — Emergency Department: Payer: 59

## 2016-07-16 DIAGNOSIS — F17200 Nicotine dependence, unspecified, uncomplicated: Secondary | ICD-10-CM | POA: Insufficient documentation

## 2016-07-16 DIAGNOSIS — J45909 Unspecified asthma, uncomplicated: Secondary | ICD-10-CM | POA: Insufficient documentation

## 2016-07-16 DIAGNOSIS — R109 Unspecified abdominal pain: Secondary | ICD-10-CM | POA: Diagnosis present

## 2016-07-16 DIAGNOSIS — N201 Calculus of ureter: Secondary | ICD-10-CM | POA: Insufficient documentation

## 2016-07-16 DIAGNOSIS — Z79899 Other long term (current) drug therapy: Secondary | ICD-10-CM | POA: Diagnosis not present

## 2016-07-16 DIAGNOSIS — N23 Unspecified renal colic: Secondary | ICD-10-CM

## 2016-07-16 HISTORY — DX: Calculus of kidney: N20.0

## 2016-07-16 LAB — BASIC METABOLIC PANEL
Anion gap: 10 (ref 5–15)
BUN: 20 mg/dL (ref 6–20)
CO2: 24 mmol/L (ref 22–32)
Calcium: 9.7 mg/dL (ref 8.9–10.3)
Chloride: 107 mmol/L (ref 101–111)
Creatinine, Ser: 1.32 mg/dL — ABNORMAL HIGH (ref 0.61–1.24)
GFR calc Af Amer: >60 mL/min (ref >60)
GFR calc non Af Amer: >60 mL/min (ref >60)
Glucose, Bld: 83 mg/dL (ref 65–99)
Potassium: 3.8 mmol/L (ref 3.5–5.1)
Sodium: 141 mmol/L (ref 135–145)

## 2016-07-16 LAB — URINALYSIS COMPLETE WITH MICROSCOPIC (ARMC ONLY)
Bacteria, UA: NONE SEEN
Bilirubin Urine: NEGATIVE
Glucose, UA: NEGATIVE mg/dL
Ketones, ur: NEGATIVE mg/dL
Leukocytes, UA: NEGATIVE
Nitrite: NEGATIVE
Protein, ur: NEGATIVE mg/dL
Specific Gravity, Urine: 1.023 (ref 1.005–1.030)
Squamous Epithelial / LPF: NONE SEEN
pH: 5 (ref 5.0–8.0)

## 2016-07-16 LAB — CBC
HCT: 46.7 % (ref 40.0–52.0)
Hemoglobin: 16.1 g/dL (ref 13.0–18.0)
MCH: 28.9 pg (ref 26.0–34.0)
MCHC: 34.4 g/dL (ref 32.0–36.0)
MCV: 83.9 fL (ref 80.0–100.0)
Platelets: 194 10*3/uL (ref 150–440)
RBC: 5.56 MIL/uL (ref 4.40–5.90)
RDW: 14.1 % (ref 11.5–14.5)
WBC: 9.8 10*3/uL (ref 3.8–10.6)

## 2016-07-16 MED ORDER — ONDANSETRON HCL 4 MG/2ML IJ SOLN
4.0000 mg | Freq: Once | INTRAMUSCULAR | Status: AC | PRN
  Administered 2016-07-16: 4 mg via INTRAVENOUS

## 2016-07-16 MED ORDER — ONDANSETRON 4 MG PO TBDP: 4.0000 mg | ORAL_TABLET | Freq: Three times a day (TID) | ORAL | 0 refills | Status: DC | PRN

## 2016-07-16 MED ORDER — NAPROXEN 500 MG PO TABS: 500.0000 mg | ORAL_TABLET | Freq: Two times a day (BID) | ORAL | 0 refills | Status: DC

## 2016-07-16 MED ORDER — FENTANYL CITRATE (PF) 100 MCG/2ML IJ SOLN
INTRAMUSCULAR | Status: AC
  Administered 2016-07-16: 50 ug via INTRAVENOUS
  Filled 2016-07-16: qty 2

## 2016-07-16 MED ORDER — SODIUM CHLORIDE 0.9 % IV BOLUS (SEPSIS)
1000.0000 mL | Freq: Once | INTRAVENOUS | Status: AC
  Administered 2016-07-16: 1000 mL via INTRAVENOUS

## 2016-07-16 MED ORDER — ONDANSETRON HCL 4 MG/2ML IJ SOLN
INTRAMUSCULAR | Status: AC
  Filled 2016-07-16: qty 2

## 2016-07-16 MED ORDER — OXYCODONE-ACETAMINOPHEN 5-325 MG PO TABS: 1.0000 | ORAL_TABLET | Freq: Four times a day (QID) | ORAL | 0 refills | Status: DC | PRN

## 2016-07-16 MED ORDER — FENTANYL CITRATE (PF) 100 MCG/2ML IJ SOLN
50.0000 ug | INTRAMUSCULAR | Status: DC | PRN
  Administered 2016-07-16: 50 ug via INTRAVENOUS

## 2016-07-16 MED ORDER — OXYCODONE-ACETAMINOPHEN 5-325 MG PO TABS
ORAL_TABLET | ORAL | Status: AC
  Filled 2016-07-16: qty 1

## 2016-07-16 MED ORDER — OXYCODONE-ACETAMINOPHEN 5-325 MG PO TABS
1.0000 | ORAL_TABLET | Freq: Once | ORAL | Status: AC
  Administered 2016-07-16: 1 via ORAL

## 2016-07-16 MED ORDER — ONDANSETRON HCL 4 MG/2ML IJ SOLN
4.0000 mg | Freq: Once | INTRAMUSCULAR | Status: AC
  Administered 2016-07-16: 4 mg via INTRAVENOUS

## 2016-07-16 MED ORDER — KETOROLAC TROMETHAMINE 30 MG/ML IJ SOLN
15.0000 mg | INTRAMUSCULAR | Status: AC
  Administered 2016-07-16: 15 mg via INTRAVENOUS
  Filled 2016-07-16: qty 1

## 2016-07-16 NOTE — ED Triage Notes (Signed)
Sharp and stabbing left flank pain intermittent since this AM. Pt hx of kidney stones. Worse pain with urination.

## 2016-07-16 NOTE — ED Provider Notes (Signed)
[facility] Emergency Department Provider Note  ____________________________________________  Time seen: Approximately 6:19 PM  I have reviewed the triage vital signs and the nursing notes.   HISTORY  Chief Complaint Flank Pain    HPI Brian Contreras is a 35 y.o. male who complains of left flank pain radiating to the left scrotum that started this morning. Waxing and waning. Intermittent and severe. Associated with nausea but no vomiting or diarrhea. No fever or chills. No aggravating or alleviating factors. Feels like kidney stones that he has had in the past. Has not required stenting or lithotripsy in the past. Also reports dysuria.     Past Medical History:  Diagnosis Date  . Asthma   . Kidney stone      There are no active problems to display for this patient.    Past Surgical History:  Procedure Laterality Date  . arm surgery Bilateral   . TONSILLECTOMY    . TYMPANOSTOMY TUBE PLACEMENT       Prior to Admission medications   Medication Sig Start Date End Date Taking? Authorizing Provider  lidocaine (XYLOCAINE) 2 % solution Use as directed 20 mLs in the mouth or throat as needed for mouth pain. 05/03/16   Chinita Pester, FNP  meloxicam (MOBIC) 15 MG tablet Take 1 tablet (15 mg total) by mouth daily. 03/16/16   Delorise Royals Cuthriell, PA-C  naproxen (NAPROSYN) 500 MG tablet Take 1 tablet (500 mg total) by mouth 2 (two) times daily with a meal. 07/16/16   Sharman Cheek, MD  ondansetron (ZOFRAN ODT) 4 MG disintegrating tablet Take 1 tablet (4 mg total) by mouth every 8 (eight) hours as needed for nausea or vomiting. 07/16/16   Sharman Cheek, MD  oxyCODONE-acetaminophen (ROXICET) 5-325 MG tablet Take 1 tablet by mouth every 6 (six) hours as needed for severe pain. 07/16/16   Sharman Cheek, MD  SUMAtriptan (IMITREX) 100 MG tablet Take 100 mg by mouth every 2 (two) hours as needed for migraine. May repeat in 2 hours if headache persists or  recurs.    Historical Provider, MD     Allergies Amoxicillin; Ceclor [cefaclor]; Penicillins; and Sulfa antibiotics   Family History  Problem Relation Age of Onset  . Arthritis Mother   . Diabetes Mother   . Kidney disease Mother   . Migraines Mother   . Arthritis Father   . Arthritis Brother     Social History Social History  Substance Use Topics  . Smoking status: Current Some Day Smoker  . Smokeless tobacco: Not on file  . Alcohol use Yes     Comment: occasional    Review of Systems  Constitutional:   No fever or chills.  ENT:   No sore throat. No rhinorrhea. Cardiovascular:   No chest pain. Respiratory:   No dyspnea or cough. Gastrointestinal:   Positive abdominal pain without vomiting or diarrhea.  Genitourinary:   Positive dysuria. Musculoskeletal:   Negative for focal pain or swelling Neurological:   Negative for headaches 10-point ROS otherwise negative.  ____________________________________________   PHYSICAL EXAM:  VITAL SIGNS: ED Triage Vitals [07/16/16 1706]  Enc Vitals Group     BP 132/89     Pulse Rate 97     Resp 18     Temp 98.1 F (36.7 C)     Temp Source Oral     SpO2 99 %     Weight 266 lb (120.7 kg)     Height 6\' 6"  (1.981 m)  Head Circumference      Peak Flow      Pain Score 10     Pain Loc      Pain Edu?      Excl. in GC?     Vital signs reviewed, nursing assessments reviewed.   Constitutional:   Alert and oriented. Uncomfortable but not in distress. Eyes:   No scleral icterus. No conjunctival pallor. PERRL. EOMI.  No nystagmus. ENT   Head:   Normocephalic and atraumatic.   Nose:   No congestion/rhinnorhea. No septal hematoma   Mouth/Throat:   MMM, no pharyngeal erythema. No peritonsillar mass.    Neck:   No stridor. No SubQ emphysema. No meningismus. Hematological/Lymphatic/Immunilogical:   No cervical lymphadenopathy. Cardiovascular:   RRR. Symmetric bilateral radial and DP pulses.  No murmurs.   Respiratory:   Normal respiratory effort without tachypnea nor retractions. Breath sounds are clear and equal bilaterally. No wheezes/rales/rhonchi. Gastrointestinal:   Soft with left-sided tenderness. Non distended. There is left side CVA tenderness.  No rebound, rigidity, or guarding. Genitourinary:   deferred Musculoskeletal:   Nontender with normal range of motion in all extremities. No joint effusions.  No lower extremity tenderness.  No edema. Neurologic:   Normal speech and language.  CN 2-10 normal. Motor grossly intact. No gross focal neurologic deficits are appreciated.  Skin:    Skin is warm, dry and intact. No rash noted.  No petechiae, purpura, or bullae.  ____________________________________________    LABS (pertinent positives/negatives) (all labs ordered are listed, but only abnormal results are displayed) Labs Reviewed  URINALYSIS COMPLETEWITH MICROSCOPIC ([facility] ONLY) - Abnormal; Notable for the following:       Result Value   Color, Urine YELLOW (*)    APPearance CLEAR (*)    Hgb urine dipstick 3+ (*)    All other components within normal limits  BASIC METABOLIC PANEL - Abnormal; Notable for the following:    Creatinine, Ser 1.32 (*)    All other components within normal limits  CBC   ____________________________________________   EKG    ____________________________________________    RADIOLOGY  CT abdomen and pelvis shows 3 mm stone in the left ureter at the UVJ. No evidence of obstruction.  ____________________________________________   PROCEDURES Procedures  ____________________________________________   INITIAL IMPRESSION / ASSESSMENT AND PLAN / ED COURSE  Pertinent labs & imaging results that were available during my care of the patient were reviewed by me and considered in my medical decision making (see chart for details).  Patient very uncomfortable with left flank pain but not in distress, unremarkable vital signs. Due to abdominal  tenderness and dysuria, get a CT scan to evaluate for obstruction or other abdominal pathology while following up on urinalysis and giving IV fluids and Toradol for pain control.     Clinical Course as of Jul 16 2038  Sat Jul 16, 2016  1929 CT = 3mm stone at L UVJ. F/u UA.   [PS]    Clinical Course User Index [PS] Sharman CheekPhillip Marquette Piontek, MD    ----------------------------------------- 8:39 PM on 07/16/2016 -----------------------------------------  Patient well appearing no acute distress. Vital signs stable. Symptoms controlled. Workup reveals 3 mm stone in left UVJ, likely to spontaneously pass a very soon. We'll continue controlling symptoms with medications, discharge home, follow-up with primary care. No urinary tract infection or high-grade obstruction. ____________________________________________   FINAL CLINICAL IMPRESSION(S) / ED DIAGNOSES  Final diagnoses:  Renal colic on left side  Ureterolithiasis       Portions  of this note were generated with dragon dictation software. Dictation errors may occur despite best attempts at proofreading.    Sharman CheekPhillip Shade Kaley, MD 07/16/16 2039

## 2016-07-16 NOTE — ED Notes (Signed)
Reviewed d/c instructions, follow-up care, need to increase fluids, prescriptions with pt. Pt verbalized understanding

## 2017-06-29 ENCOUNTER — Emergency Department
Admission: EM | Admit: 2017-06-29 | Discharge: 2017-06-29 | Disposition: A | Discharge status: Home / Self Care | Payer: 59 | Attending: Emergency Medicine | Admitting: Emergency Medicine

## 2017-06-29 ENCOUNTER — Encounter: Payer: Self-pay | Admitting: *Deleted

## 2017-06-29 ENCOUNTER — Other Ambulatory Visit: Payer: Self-pay

## 2017-06-29 DIAGNOSIS — Z791 Long term (current) use of non-steroidal anti-inflammatories (NSAID): Secondary | ICD-10-CM | POA: Diagnosis not present

## 2017-06-29 DIAGNOSIS — M545 Low back pain: Secondary | ICD-10-CM | POA: Diagnosis present

## 2017-06-29 DIAGNOSIS — N23 Unspecified renal colic: Secondary | ICD-10-CM | POA: Diagnosis not present

## 2017-06-29 DIAGNOSIS — J45909 Unspecified asthma, uncomplicated: Secondary | ICD-10-CM | POA: Diagnosis not present

## 2017-06-29 LAB — COMPREHENSIVE METABOLIC PANEL
ALK PHOS: 67 U/L (ref 38–126)
ALT: 31 U/L (ref 17–63)
AST: 30 U/L (ref 15–41)
Albumin: 4.3 g/dL (ref 3.5–5.0)
Anion gap: 10 (ref 5–15)
BUN: 21 mg/dL — AB (ref 6–20)
CALCIUM: 9.5 mg/dL (ref 8.9–10.3)
CO2: 22 mmol/L (ref 22–32)
CREATININE: 1.16 mg/dL (ref 0.61–1.24)
Chloride: 107 mmol/L (ref 101–111)
GFR calc Af Amer: >60 mL/min (ref >60)
Glucose, Bld: 98 mg/dL (ref 65–99)
Potassium: 3.9 mmol/L (ref 3.5–5.1)
Sodium: 139 mmol/L (ref 135–145)
TOTAL PROTEIN: 7.6 g/dL (ref 6.5–8.1)
Total Bilirubin: 0.5 mg/dL (ref 0.3–1.2)

## 2017-06-29 LAB — URINALYSIS, COMPLETE (UACMP) WITH MICROSCOPIC
BILIRUBIN URINE: NEGATIVE
GLUCOSE, UA: NEGATIVE mg/dL
Hgb urine dipstick: NEGATIVE
KETONES UR: NEGATIVE mg/dL
LEUKOCYTES UA: NEGATIVE
NITRITE: NEGATIVE
PH: 5 (ref 5.0–8.0)
Protein, ur: NEGATIVE mg/dL
SQUAMOUS EPITHELIAL / LPF: NONE SEEN
Specific Gravity, Urine: 1.026 (ref 1.005–1.030)

## 2017-06-29 LAB — CBC
HCT: 48.8 % (ref 40.0–52.0)
Hemoglobin: 16.2 g/dL (ref 13.0–18.0)
MCH: 28.1 pg (ref 26.0–34.0)
MCHC: 33.2 g/dL (ref 32.0–36.0)
MCV: 84.6 fL (ref 80.0–100.0)
PLATELETS: 190 10*3/uL (ref 150–440)
RBC: 5.76 MIL/uL (ref 4.40–5.90)
RDW: 13.9 % (ref 11.5–14.5)
WBC: 6.4 10*3/uL (ref 3.8–10.6)

## 2017-06-29 MED ORDER — IBUPROFEN 400 MG PO TABS: 600.0000 mg | ORAL_TABLET | Freq: Once | ORAL | Status: DC

## 2017-06-29 MED ORDER — IBUPROFEN 800 MG PO TABS
ORAL_TABLET | ORAL | Status: AC
  Administered 2017-06-29: 800 mg via ORAL
  Filled 2017-06-29: qty 1

## 2017-06-29 MED ORDER — IBUPROFEN 800 MG PO TABS
800.0000 mg | ORAL_TABLET | Freq: Once | ORAL | Status: AC
  Administered 2017-06-29: 800 mg via ORAL

## 2017-06-29 MED ORDER — IBUPROFEN 600 MG PO TABS: 600.0000 mg | ORAL_TABLET | Freq: Three times a day (TID) | ORAL | 0 refills | Status: DC | PRN

## 2017-06-29 MED ORDER — HYDROMORPHONE HCL 1 MG/ML IJ SOLN
2.0000 mg | Freq: Once | INTRAMUSCULAR | Status: AC
  Administered 2017-06-29: 2 mg via INTRAMUSCULAR
  Filled 2017-06-29: qty 2

## 2017-06-29 MED ORDER — HYDROCODONE-ACETAMINOPHEN 5-325 MG PO TABS: 1.0000 | ORAL_TABLET | Freq: Four times a day (QID) | ORAL | 0 refills | Status: DC | PRN

## 2017-06-29 NOTE — ED Notes (Signed)
Pt states having trouble walking and changing certain positions because it triggers sharp shooting pains in right flank region.

## 2017-06-29 NOTE — Discharge Instructions (Signed)
Please take your pain medication as needed for severe symptoms and return to the emergency department for any new or worsening symptoms such as fevers, chills, worsening pain, if you cannot eat or drink, or for any other concerns whatsoever.  It was a pleasure to take care of you today, and thank you for coming to our emergency department.  If you have any questions or concerns before leaving please ask the nurse to grab me and I'm more than happy to go through your aftercare instructions again.  If you were prescribed any opioid pain medication today such as Norco, Vicodin, Percocet, morphine, hydrocodone, or oxycodone please make sure you do not drive when you are taking this medication as it can alter your ability to drive safely.  If you have any concerns once you are home that you are not improving or are in fact getting worse before you can make it to your follow-up appointment, please do not hesitate to call 911 and come back for further evaluation.  Merrily BrittleNeil Myran Arcia, MD  Results for orders placed or performed during the hospital encounter of 06/29/17  Comprehensive metabolic panel  Result Value Ref Range   Sodium 139 135 - 145 mmol/L   Potassium 3.9 3.5 - 5.1 mmol/L   Chloride 107 101 - 111 mmol/L   CO2 22 22 - 32 mmol/L   Glucose, Bld 98 65 - 99 mg/dL   BUN 21 (H) 6 - 20 mg/dL   Creatinine, Ser 1.611.16 0.61 - 1.24 mg/dL   Calcium 9.5 8.9 - 09.610.3 mg/dL   Total Protein 7.6 6.5 - 8.1 g/dL   Albumin 4.3 3.5 - 5.0 g/dL   AST 30 15 - 41 U/L   ALT 31 17 - 63 U/L   Alkaline Phosphatase 67 38 - 126 U/L   Total Bilirubin 0.5 0.3 - 1.2 mg/dL   GFR calc non Af Amer >60 >60 mL/min   GFR calc Af Amer >60 >60 mL/min   Anion gap 10 5 - 15  CBC  Result Value Ref Range   WBC 6.4 3.8 - 10.6 K/uL   RBC 5.76 4.40 - 5.90 MIL/uL   Hemoglobin 16.2 13.0 - 18.0 g/dL   HCT 04.548.8 40.940.0 - 81.152.0 %   MCV 84.6 80.0 - 100.0 fL   MCH 28.1 26.0 - 34.0 pg   MCHC 33.2 32.0 - 36.0 g/dL   RDW 91.413.9 78.211.5 - 95.614.5 %   Platelets 190 150 - 440 K/uL  Urinalysis, Complete w Microscopic  Result Value Ref Range   Color, Urine YELLOW (A) YELLOW   APPearance CLEAR (A) CLEAR   Specific Gravity, Urine 1.026 1.005 - 1.030   pH 5.0 5.0 - 8.0   Glucose, UA NEGATIVE NEGATIVE mg/dL   Hgb urine dipstick NEGATIVE NEGATIVE   Bilirubin Urine NEGATIVE NEGATIVE   Ketones, ur NEGATIVE NEGATIVE mg/dL   Protein, ur NEGATIVE NEGATIVE mg/dL   Nitrite NEGATIVE NEGATIVE   Leukocytes, UA NEGATIVE NEGATIVE   RBC / HPF 0-5 0 - 5 RBC/hpf   WBC, UA 0-5 0 - 5 WBC/hpf   Bacteria, UA RARE (A) NONE SEEN   Squamous Epithelial / LPF NONE SEEN NONE SEEN   Mucus PRESENT

## 2017-06-29 NOTE — ED Triage Notes (Signed)
Pt to triage via wheelchair.  Pt has left lower back pain.  No known injury.  No diff urinating.  Hx of kidney stones

## 2017-06-29 NOTE — ED Notes (Signed)
FIRST NURSE NOTE: Pt states that he took a step yesterday and to pick something up, states lower back hurting since. Pt ambulates with ease.

## 2017-06-29 NOTE — ED Provider Notes (Signed)
[facility] Emergency Department Provider Note  ____________________________________________   First MD Initiated Contact with Patient 06/29/17 2008     (approximate)  I have reviewed the triage vital signs and the nursing notes.   HISTORY  Chief Complaint Back Pain    HPI Brian Contreras is a 36 y.o. male who self presents to the emergency department with sudden onset severe right mid to low back pain that radiates downwards towards his groin that began while walking yesterday.  He denies lifting.  He denies trauma.  He denies stooping.  He says this feels identical to previous kidney stones.  He denies dysuria or hematuria.  He denies fevers or chills.  The pain is associated with nausea but no vomiting.  Nothing particular seems to make the pain better or worse.  He has normal perianal sensation.  No bowel or bladder incontinence or hesitancy.   Past Medical History:  Diagnosis Date  . Asthma   . Kidney stone     There are no active problems to display for this patient.   Past Surgical History:  Procedure Laterality Date  . arm surgery Bilateral   . TONSILLECTOMY    . TYMPANOSTOMY TUBE PLACEMENT      Prior to Admission medications   Medication Sig Start Date End Date Taking? Authorizing Provider  HYDROcodone-acetaminophen (NORCO) 5-325 MG tablet Take 1 tablet every 6 (six) hours as needed for up to 21 doses by mouth for severe pain. 06/29/17   Merrily Brittleifenbark, Illya Gienger, MD  ibuprofen (ADVIL,MOTRIN) 600 MG tablet Take 1 tablet (600 mg total) every 8 (eight) hours as needed by mouth. 06/29/17   Merrily Brittleifenbark, Mateya Torti, MD  lidocaine (XYLOCAINE) 2 % solution Use as directed 20 mLs in the mouth or throat as needed for mouth pain. 05/03/16   Triplett, Rulon Eisenmengerari B, FNP  meloxicam (MOBIC) 15 MG tablet Take 1 tablet (15 mg total) by mouth daily. 03/16/16   Cuthriell, Delorise RoyalsJonathan D, PA-C  naproxen (NAPROSYN) 500 MG tablet Take 1 tablet (500 mg total) by mouth 2 (two) times daily  with a meal. 07/16/16   Sharman CheekStafford, Phillip, MD  ondansetron (ZOFRAN ODT) 4 MG disintegrating tablet Take 1 tablet (4 mg total) by mouth every 8 (eight) hours as needed for nausea or vomiting. 07/16/16   Sharman CheekStafford, Phillip, MD  oxyCODONE-acetaminophen (ROXICET) 5-325 MG tablet Take 1 tablet by mouth every 6 (six) hours as needed for severe pain. 07/16/16   Sharman CheekStafford, Phillip, MD  SUMAtriptan (IMITREX) 100 MG tablet Take 100 mg by mouth every 2 (two) hours as needed for migraine. May repeat in 2 hours if headache persists or recurs.    [provider]    Allergies Amoxicillin; Ceclor [cefaclor]; Penicillins; and Sulfa antibiotics  Family History  Problem Relation Age of Onset  . Arthritis Mother   . Diabetes Mother   . Kidney disease Mother   . Migraines Mother   . Arthritis Father   . Arthritis Brother     Social History Social History   Tobacco Use  . Smoking status: Never Smoker  . Smokeless tobacco: Never Used  Substance Use Topics  . Alcohol use: Yes    Comment: occasional  . Drug use: No    Review of Systems Constitutional: No fever/chills ENT: No sore throat. Cardiovascular: Denies chest pain. Respiratory: Denies shortness of breath. Gastrointestinal: Positive for abdominal pain.  Positive for nausea, no vomiting.  No diarrhea.  No constipation. Musculoskeletal: Positive for back pain. Neurological: Negative for headaches  ____________________________________________   PHYSICAL EXAM:  VITAL SIGNS: ED Triage Vitals  Enc Vitals Group     BP 06/29/17 1821 126/79     Pulse Rate 06/29/17 1821 (!) 103     Resp 06/29/17 1821 20     Temp 06/29/17 1821 98.7 F (37.1 C)     Temp Source 06/29/17 1821 Oral     SpO2 06/29/17 1821 99 %     Weight 06/29/17 1821 265 lb (120.2 kg)     Height 06/29/17 1821 6\' 6"  (1.981 m)     Head Circumference --      Peak Flow --      Pain Score 06/29/17 1820 7     Pain Loc --      Pain Edu? --      Excl. in GC? --      Constitutional: Alert oriented x4 appears miserable lying prone wincing and holding his right flank Head: Atraumatic. Nose: No congestion/rhinnorhea. Mouth/Throat: No trismus Neck: No stridor.   Cardiovascular: Tachycardic regular rhythm Respiratory: Normal respiratory effort.  No retractions. Gastrointestinal: Soft nontender MSK: 5 out of 5 all 4 Neurologic:  Normal speech and language. No gross focal neurologic deficits are appreciated.  Skin:  Skin is warm, dry and intact. No rash noted.    ____________________________________________  LABS (all labs ordered are listed, but only abnormal results are displayed)  Labs Reviewed  COMPREHENSIVE METABOLIC PANEL - Abnormal; Notable for the following components:      Result Value   BUN 21 (*)    All other components within normal limits  URINALYSIS, COMPLETE (UACMP) WITH MICROSCOPIC - Abnormal; Notable for the following components:   Color, Urine YELLOW (*)    APPearance CLEAR (*)    Bacteria, UA RARE (*)    All other components within normal limits  CBC    Blood work reviewed and interpreted by me with no acute disease __________________________________________  EKG   ____________________________________________  RADIOLOGY   ____________________________________________   DIFFERENTIAL includes but not limited to  Renal colic, pyelonephritis, musculoskeletal pain, epidural abscess   PROCEDURES  Procedure(s) performed: no  Procedures  Critical Care performed: no  Observation: no ____________________________________________   INITIAL IMPRESSION / ASSESSMENT AND PLAN / ED COURSE  Pertinent labs & imaging results that were available during my care of the patient were reviewed by me and considered in my medical decision making (see chart for details).  I had a lengthy discussion with the patient regarding his symptoms.  He said this feels identical to previous kidney stones and the pain began suddenly  without any trauma or awkward movements.  I discussed the lack of hematuria however this test is not 100% and is reasonable to treat him as a kidney stone.  We discussed the risks and benefits of the CT scan today and he opted against a CT scan which I think is entirely reasonable.  2 mg of intramuscular Dilaudid as well as 800 mg of ibuprofen now and will reevaluate.     ----------------------------------------- 9:39 PM on 06/29/2017 -----------------------------------------  The patient's pain is manageable.  At this point CT scan deferred.  Strict return precautions have been given and the patient verbalizes understanding and agreement with the plan. ____________________________________________   FINAL CLINICAL IMPRESSION(S) / ED DIAGNOSES  Final diagnoses:  Renal colic      NEW MEDICATIONS STARTED DURING THIS VISIT:  This SmartLink is deprecated. Use AVSMEDLIST instead to display the medication list for a patient.   Note:  This  document was prepared using Conservation officer, historic buildings and may include unintentional dictation errors.      Merrily Brittle, MD 06/30/17 1517

## 2017-07-03 ENCOUNTER — Encounter: Payer: Self-pay | Admitting: Unknown Physician Specialty

## 2018-08-20 ENCOUNTER — Encounter: Payer: Self-pay | Admitting: Family Medicine

## 2018-08-20 ENCOUNTER — Ambulatory Visit (INDEPENDENT_AMBULATORY_CARE_PROVIDER_SITE_OTHER): Payer: 59 | Admitting: Family Medicine

## 2018-08-20 VITALS — BP 151/106 | HR 108 | Temp 98.6°F | Ht 76.0 in | Wt 295.2 lb

## 2018-08-20 DIAGNOSIS — Z7689 Persons encountering health services in other specified circumstances: Secondary | ICD-10-CM | POA: Diagnosis not present

## 2018-08-20 DIAGNOSIS — G43009 Migraine without aura, not intractable, without status migrainosus: Secondary | ICD-10-CM

## 2018-08-20 DIAGNOSIS — Z6835 Body mass index (BMI) 35.0-35.9, adult: Secondary | ICD-10-CM

## 2018-08-20 DIAGNOSIS — I10 Essential (primary) hypertension: Secondary | ICD-10-CM | POA: Diagnosis not present

## 2018-08-20 DIAGNOSIS — Z23 Encounter for immunization: Secondary | ICD-10-CM | POA: Diagnosis not present

## 2018-08-20 MED ORDER — LISINOPRIL 10 MG PO TABS: 10.0000 mg | ORAL_TABLET | Freq: Every day | ORAL | 0 refills | Status: DC

## 2018-08-20 MED ORDER — SUMATRIPTAN SUCCINATE 100 MG PO TABS: ORAL_TABLET | ORAL | 2 refills | Status: AC

## 2018-08-20 NOTE — Progress Notes (Signed)
BP (!) 151/106 (BP Location: Right Arm, Patient Position: Sitting, Cuff Size: Large)   Pulse (!) 108   Temp 98.6 F (37 C)   Ht 6\' 4"  (1.93 m)   Wt 295 lb 4 oz (133.9 kg)   SpO2 97%   BMI 35.94 kg/m    Subjective:    Patient ID: Brian Contreras, male    DOB: 12/06/1980, 38 y.o.   MRN: [id number]  HPI: Brian Contreras is a 38 y.o. male  Chief Complaint  Patient presents with  . Annual Exam   Here today to establish care.   Has always had mildly elevated blood pressures, especially in a clinic setting. Home readings high 140s/90s. Does feel like his exercise tolerance is limited due to his elevated BPs, feels like his heart pounds too hard when he exercises. Wanting to try medication for this.   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 Concerned about conditioning, weight. Will not eat produce, eats meat and starches. Has been this way his entire life. Wanting advice on how to stay healthy.    Hx of migraines, was previously on imitrex and fioricet but has not had those in a while. Having about 3-4 migraines monthly currently. Feels caffeine and stress are major triggers. Was taking fioricet each time he had one which seemed to help. Also taking excedrin prn which also helps.   Relevant past medical, surgical, family and social history reviewed and updated as indicated. Interim medical history since our last visit reviewed. Allergies and medications reviewed and updated.  Review of Systems  Per HPI unless specifically indicated above     Objective:    BP (!) 151/106 (BP Location: Right Arm, Patient Position: Sitting, Cuff Size: Large)   Pulse (!) 108   Temp 98.6 F (37 C)   Ht 6\' 4"  (1.93 m)   Wt 295 lb 4 oz (133.9 kg)   SpO2 97%   BMI 35.94 kg/m   Wt Readings from Last 3 Encounters:  08/20/18 295 lb 4 oz (133.9 kg)  06/29/17 265 lb (120.2 kg)  07/16/16 266 lb (120.7 kg)      Physical Exam Vitals signs and nursing note reviewed.  Constitutional:      Appearance: Normal appearance. He is obese.  HENT:     Head: Atraumatic.  Eyes:     Extraocular Movements: Extraocular movements intact.     Conjunctiva/sclera: Conjunctivae normal.  Neck:     Musculoskeletal: Normal range of motion and neck supple.  Cardiovascular:     Rate and Rhythm: Normal rate and regular rhythm.  Pulmonary:     Effort: Pulmonary effort is normal.     Breath sounds: Normal breath sounds.  Musculoskeletal: Normal range of motion.  Skin:    General: Skin is warm and dry.  Neurological:     General: No focal deficit present.     Mental Status: He is oriented to person, place, and time.  Psychiatric:        Mood and Affect: Mood normal.        Thought Content: Thought content normal.        Judgment: Judgment normal.     Results for orders placed or performed during the hospital encounter of 06/29/17  Comprehensive metabolic panel  Result Value Ref Range   Sodium 139 135 - 145 mmol/L   Potassium 3.9 3.5 - 5.1 mmol/L  Chloride 107 101 - 111 mmol/L   CO2 22 22 - 32 mmol/L   Glucose, Bld 98 65 - 99 mg/dL   BUN 21 (H) 6 - 20 mg/dL   Creatinine, Ser 2.94 0.61 - 1.24 mg/dL   Calcium 9.5 8.9 - 76.5 mg/dL   Total Protein 7.6 6.5 - 8.1 g/dL   Albumin 4.3 3.5 - 5.0 g/dL   AST 30 15 - 41 U/L   ALT 31 17 - 63 U/L   Alkaline Phosphatase 67 38 - 126 U/L   Total Bilirubin 0.5 0.3 - 1.2 mg/dL   GFR calc non Af Amer >60 >60 mL/min   GFR calc Af Amer >60 >60 mL/min   Anion gap 10 5 - 15  CBC  Result Value Ref Range   WBC 6.4 3.8 - 10.6 K/uL   RBC 5.76 4.40 - 5.90 MIL/uL   Hemoglobin 16.2 13.0 - 18.0 g/dL   HCT 46.5 03.5 - 46.5 %   MCV 84.6 80.0 - 100.0 fL   MCH 28.1 26.0 - 34.0 pg   MCHC 33.2 32.0 - 36.0 g/dL   RDW 68.1 27.5 - 17.0 %   Platelets 190 150 - 440 K/uL  Urinalysis, Complete w Microscopic  Result Value Ref Range   Color, Urine YELLOW (A) YELLOW   APPearance CLEAR  (A) CLEAR   Specific Gravity, Urine 1.026 1.005 - 1.030   pH 5.0 5.0 - 8.0   Glucose, UA NEGATIVE NEGATIVE mg/dL   Hgb urine dipstick NEGATIVE NEGATIVE   Bilirubin Urine NEGATIVE NEGATIVE   Ketones, ur NEGATIVE NEGATIVE mg/dL   Protein, ur NEGATIVE NEGATIVE mg/dL   Nitrite NEGATIVE NEGATIVE   Leukocytes, UA NEGATIVE NEGATIVE   RBC / HPF 0-5 0 - 5 RBC/hpf   WBC, UA 0-5 0 - 5 WBC/hpf   Bacteria, UA RARE (A) NONE SEEN   Squamous Epithelial / LPF NONE SEEN NONE SEEN   Mucus PRESENT       Assessment & Plan:   Problem List Items Addressed This Visit      Cardiovascular and Mediastinum   Essential hypertension - Primary    BPs above normal range. Start 10 mg lisinopril and work on diet and exercise. Check at home readings, f/u in 1 month      Relevant Medications   lisinopril (PRINIVIL,ZESTRIL) 10 MG tablet   Migraines    Discussed safety risks with fioricet, start imitrex prn. If using frequently, will consider a daily prophylactic medication      Relevant Medications   lisinopril (PRINIVIL,ZESTRIL) 10 MG tablet   SUMAtriptan (IMITREX) 100 MG tablet     Other   Class 2 severe obesity due to excess calories with serious comorbidity and body mass index (BMI) of 35.0 to 35.9 in adult [facility])    Long discussion about broadening dietary intake. Referral to dietician placed to discuss further. Exercise regimen reviewed      Relevant Orders   Amb Referral to Nutrition and Diabetic E    Other Visit Diagnoses    Encounter to establish care       Immunization due       Relevant Orders   Tdap vaccine greater than or equal to 7yo IM (Completed)       Follow up plan: Return in about 4 weeks (around 09/17/2018) for CPE, BP.

## 2018-08-23 DIAGNOSIS — Z6835 Body mass index (BMI) 35.0-35.9, adult: Secondary | ICD-10-CM

## 2018-08-23 DIAGNOSIS — G43909 Migraine, unspecified, not intractable, without status migrainosus: Secondary | ICD-10-CM | POA: Insufficient documentation

## 2018-08-23 DIAGNOSIS — E66812 Obesity, class 2: Secondary | ICD-10-CM | POA: Insufficient documentation

## 2018-08-23 NOTE — Assessment & Plan Note (Signed)
BPs above normal range. Start 10 mg lisinopril and work on diet and exercise. Check at home readings, f/u in 1 month

## 2018-08-23 NOTE — Assessment & Plan Note (Signed)
Discussed safety risks with fioricet, start imitrex prn. If using frequently, will consider a daily prophylactic medication

## 2018-08-23 NOTE — Assessment & Plan Note (Signed)
Long discussion about broadening dietary intake. Referral to dietician placed to discuss further. Exercise regimen reviewed

## 2018-09-17 ENCOUNTER — Ambulatory Visit (INDEPENDENT_AMBULATORY_CARE_PROVIDER_SITE_OTHER): Payer: 59 | Admitting: Family Medicine

## 2018-09-17 ENCOUNTER — Telehealth: Payer: Self-pay

## 2018-09-17 ENCOUNTER — Other Ambulatory Visit: Payer: Self-pay

## 2018-09-17 ENCOUNTER — Encounter: Payer: Self-pay | Admitting: Family Medicine

## 2018-09-17 VITALS — BP 125/91 | HR 90 | Temp 99.0°F | Ht 75.5 in | Wt 287.3 lb

## 2018-09-17 DIAGNOSIS — G43009 Migraine without aura, not intractable, without status migrainosus: Secondary | ICD-10-CM

## 2018-09-17 DIAGNOSIS — Z0000 Encounter for general adult medical examination without abnormal findings: Secondary | ICD-10-CM

## 2018-09-17 DIAGNOSIS — J4540 Moderate persistent asthma, uncomplicated: Secondary | ICD-10-CM

## 2018-09-17 DIAGNOSIS — I10 Essential (primary) hypertension: Secondary | ICD-10-CM

## 2018-09-17 DIAGNOSIS — Z113 Encounter for screening for infections with a predominantly sexual mode of transmission: Secondary | ICD-10-CM

## 2018-09-17 DIAGNOSIS — Z114 Encounter for screening for human immunodeficiency virus [HIV]: Secondary | ICD-10-CM

## 2018-09-17 DIAGNOSIS — J45909 Unspecified asthma, uncomplicated: Secondary | ICD-10-CM | POA: Insufficient documentation

## 2018-09-17 DIAGNOSIS — Z021 Encounter for pre-employment examination: Secondary | ICD-10-CM

## 2018-09-17 LAB — UA/M W/RFLX CULTURE, ROUTINE
Bilirubin, UA: NEGATIVE
Glucose, UA: NEGATIVE
KETONES UA: NEGATIVE
LEUKOCYTES UA: NEGATIVE
NITRITE UA: NEGATIVE
PH UA: 5 (ref 5.0–7.5)
Protein, UA: NEGATIVE
RBC, UA: NEGATIVE
Specific Gravity, UA: 1.02 (ref 1.005–1.030)
UUROB: 0.2 mg/dL (ref 0.2–1.0)

## 2018-09-17 MED ORDER — ALBUTEROL SULFATE HFA 108 (90 BASE) MCG/ACT IN AERS: 2.0000 | INHALATION_SPRAY | Freq: Four times a day (QID) | RESPIRATORY_TRACT | 11 refills | Status: AC | PRN

## 2018-09-17 MED ORDER — FLUTICASONE PROPIONATE HFA 44 MCG/ACT IN AERO: 1.0000 | INHALATION_SPRAY | Freq: Two times a day (BID) | RESPIRATORY_TRACT | 12 refills | Status: DC

## 2018-09-17 NOTE — Assessment & Plan Note (Signed)
Noting more frequent issues now that he's exercising more, will restart flovent and prn albuterol regimen

## 2018-09-17 NOTE — Telephone Encounter (Signed)
PA submitted via cover my meds for Flovent, awaiting for approval or denial.

## 2018-09-17 NOTE — Assessment & Plan Note (Signed)
Improved, suspect will continue to improve with the excellent lifestyle changes he's making. Continue to monitor, call with persistent abnormal readings. Continue lisinopril 10 mg.

## 2018-09-17 NOTE — Assessment & Plan Note (Signed)
Stable, continue current regimen 

## 2018-09-17 NOTE — Progress Notes (Signed)
BP (!) 125/91 (BP Location: Left Arm, Patient Position: Sitting, Cuff Size: Large)   Pulse 90   Temp 99 F (37.2 C)   Ht 6' 3.5" (1.918 m)   Wt 287 lb 5 oz (130.3 kg)   SpO2 98%   BMI 35.44 kg/m    Subjective:    Patient ID: Brian Contreras, male    DOB: February 26, 1981, 38 y.o.   MRN: [id number]  HPI: Brian Contreras is a 38 y.o. male presenting on 09/17/2018 for comprehensive medical examination. Current medical complaints include:see below  Home BPs have been fairly consistently lower since addition of lisinopril, 130s-140s/90s when checked. Tolerating the medicine well, no side effects noted. Denies CP, SOB, dizziness.   Has been overhauling his diet, eating smaller portions and cutting out red meats, fast food, sodas. Has also started running and training for a 5k. Has noticed since exercising again that his asthma seems to be flaring some with the added activity. Used to be on flovent and albuterol and wanting to restart.   He currently lives with: Interim Problems from his last visit: no  Depression Screen done today and results listed below:  Depression screen [facility] 2/9 08/20/2018  Decreased Interest 0  Down, Depressed, Hopeless 0  PHQ - 2 Score 0     The patient does not have a history of falls. I did not complete a risk assessment for falls. A plan of care for falls was not documented.   Past Medical History:  Past Medical History:  Diagnosis Date  . Asthma   . Kidney stone   . Migraines     Surgical History:  Past Surgical History:  Procedure Laterality Date  . ADENOIDECTOMY    . arm surgery Bilateral   . TONSILLECTOMY    . TYMPANOSTOMY TUBE PLACEMENT    . WISDOM TOOTH EXTRACTION      Medications:  Current Outpatient Medications on File Prior to Visit  Medication Sig  . lisinopril (PRINIVIL,ZESTRIL) 10 MG tablet Take 1 tablet (10 mg total) by mouth daily.  . SUMAtriptan (IMITREX) 100 MG tablet May repeat in 2 hours if headache persists or recurs.    No current facility-administered medications on file prior to visit.     Allergies:  Allergies  Allergen Reactions  . Amoxicillin Swelling  . Ceclor [Cefaclor] Other (See Comments)  . Penicillins Swelling  . Sulfa Antibiotics Other (See Comments)    Social History:  Social History   Socioeconomic History  . Marital status: Married    Spouse name: Not on file  . Number of children: Not on file  . Years of education: Not on file  . Highest education level: Not on file  Occupational History  . Not on file  Social Needs  . Financial resource strain: Not on file  . Food insecurity:    Worry: Not on file    Inability: Not on file  . Transportation needs:    Medical: Not on file    Non-medical: Not on file  Tobacco Use  . Smoking status: Never Smoker  . Smokeless tobacco: Never Used  Substance and Sexual Activity  . Alcohol use: Yes    Comment: occasional  . Drug use: No  . Sexual activity: Yes    Birth control/protection: I.U.D.  Lifestyle  . Physical activity:    Days per week: Not on file    Minutes per session: Not on file  . Stress: Not on file  Relationships  . Social connections:  Talks on phone: Not on file    Gets together: Not on file    Attends religious service: Not on file    Active member of club or organization: Not on file    Attends meetings of clubs or organizations: Not on file    Relationship status: Not on file  . Intimate partner violence:    Fear of current or ex partner: Not on file    Emotionally abused: Not on file    Physically abused: Not on file    Forced sexual activity: Not on file  Other Topics Concern  . Not on file  Social History Narrative  . Not on file   Social History   Tobacco Use  Smoking Status Never Smoker  Smokeless Tobacco Never Used   Social History   Substance and Sexual Activity  Alcohol Use Yes   Comment: occasional    Family History:  Family History  Problem Relation Age of Onset  .  Arthritis Mother   . Diabetes Mother   . Kidney disease Mother   . Migraines Mother   . Arthritis Father   . Arthritis Brother   . Torticollis Daughter   . Kidney disease Daughter        Reflux  . Diabetes Maternal Grandmother   . Hypertension Maternal Grandmother   . Heart attack Maternal Grandfather   . Cancer Paternal Grandmother   . Hypertension Paternal Grandfather   . Hyperlipidemia Paternal Grandfather     Past medical history, surgical history, medications, allergies, family history and social history reviewed with patient today and changes made to appropriate areas of the chart.   Review of Systems - General ROS: negative Psychological ROS: negative Ophthalmic ROS: negative ENT ROS: negative Allergy and Immunology ROS: negative Hematological and Lymphatic ROS: negative Endocrine ROS: negative Respiratory ROS: no cough, shortness of breath, or wheezing Cardiovascular ROS: no chest pain or dyspnea on exertion Gastrointestinal ROS: no abdominal pain, change in bowel habits, or black or bloody stools Genito-Urinary ROS: no dysuria, trouble voiding, or hematuria Musculoskeletal ROS: negative Neurological ROS: no TIA or stroke symptoms Dermatological ROS: negative All other ROS negative except what is listed above and in the HPI.      Objective:    BP (!) 125/91 (BP Location: Left Arm, Patient Position: Sitting, Cuff Size: Large)   Pulse 90   Temp 99 F (37.2 C)   Ht 6' 3.5" (1.918 m)   Wt 287 lb 5 oz (130.3 kg)   SpO2 98%   BMI 35.44 kg/m   Wt Readings from Last 3 Encounters:  09/17/18 287 lb 5 oz (130.3 kg)  08/20/18 295 lb 4 oz (133.9 kg)  06/29/17 265 lb (120.2 kg)    Physical Exam Vitals signs and nursing note reviewed.  Constitutional:      General: He is not in acute distress.    Appearance: He is well-developed.  HENT:     Head: Atraumatic.     Right Ear: External ear normal.     Left Ear: External ear normal.     Nose: Nose normal.  Eyes:      General: No scleral icterus.    Conjunctiva/sclera: Conjunctivae normal.     Pupils: Pupils are equal, round, and reactive to light.  Neck:     Musculoskeletal: Normal range of motion and neck supple.  Cardiovascular:     Rate and Rhythm: Normal rate and regular rhythm.     Heart sounds: Normal heart sounds. No murmur.  Pulmonary:     Effort: Pulmonary effort is normal. No respiratory distress.     Breath sounds: Normal breath sounds.  Abdominal:     General: Bowel sounds are normal. There is no distension.     Palpations: Abdomen is soft. There is no mass.     Tenderness: There is no abdominal tenderness. There is no guarding.  Genitourinary:    Comments: Exam declined Musculoskeletal: Normal range of motion.        General: No tenderness.  Skin:    General: Skin is warm and dry.     Findings: No rash.  Neurological:     General: No focal deficit present.     Mental Status: He is alert and oriented to person, place, and time.     Deep Tendon Reflexes: Reflexes are normal and symmetric.  Psychiatric:        Mood and Affect: Mood normal.        Behavior: Behavior normal.        Thought Content: Thought content normal.        Judgment: Judgment normal.     Results for orders placed or performed in visit on 09/17/18  GC/Chlamydia Probe Amp  Result Value Ref Range   Chlamydia trachomatis, NAA Negative Negative   Neisseria gonorrhoeae by PCR Negative Negative  CBC with Differential/Platelet  Result Value Ref Range   WBC 6.2 3.4 - 10.8 x10E3/uL   RBC 5.70 4.14 - 5.80 x10E6/uL   Hemoglobin 15.8 13.0 - 17.7 g/dL   Hematocrit 16.148.8 09.637.5 - 51.0 %   MCV 86 79 - 97 fL   MCH 27.7 26.6 - 33.0 pg   MCHC 32.4 31.5 - 35.7 g/dL   RDW 04.514.3 40.911.6 - 81.115.4 %   Platelets 208 150 - 450 x10E3/uL   Neutrophils 50 Not Estab. %   Lymphs 35 Not Estab. %   Monocytes 11 Not Estab. %   Eos 3 Not Estab. %   Basos 1 Not Estab. %   Neutrophils Absolute 3.1 1.4 - 7.0 x10E3/uL   Lymphocytes Absolute  2.1 0.7 - 3.1 x10E3/uL   Monocytes Absolute 0.7 0.1 - 0.9 x10E3/uL   EOS (ABSOLUTE) 0.2 0.0 - 0.4 x10E3/uL   Basophils Absolute 0.0 0.0 - 0.2 x10E3/uL   Immature Granulocytes 0 Not Estab. %   Immature Grans (Abs) 0.0 0.0 - 0.1 x10E3/uL  Comprehensive metabolic panel  Result Value Ref Range   Glucose 102 (H) 65 - 99 mg/dL   BUN 21 (H) 6 - 20 mg/dL   Creatinine, Ser 9.141.01 0.76 - 1.27 mg/dL   GFR calc non Af Amer 95 >59 mL/min/1.73   GFR calc Af Amer 110 >59 mL/min/1.73   BUN/Creatinine Ratio 21 (H) 9 - 20   Sodium 140 134 - 144 mmol/L   Potassium 4.5 3.5 - 5.2 mmol/L   Chloride 102 96 - 106 mmol/L   CO2 16 (L) 20 - 29 mmol/L   Calcium 10.2 8.7 - 10.2 mg/dL   Total Protein 7.5 6.0 - 8.5 g/dL   Albumin 4.7 4.0 - 5.0 g/dL   Globulin, Total 2.8 1.5 - 4.5 g/dL   Albumin/Globulin Ratio 1.7 1.2 - 2.2   Bilirubin Total 0.3 0.0 - 1.2 mg/dL   Alkaline Phosphatase 60 39 - 117 IU/L   AST 21 0 - 40 IU/L   ALT 41 0 - 44 IU/L  Lipid Panel w/o Chol/HDL Ratio  Result Value Ref Range   Cholesterol, Total 203 (H) 100 - 199  mg/dL   Triglycerides 811 0 - 149 mg/dL   HDL 36 (L) >91 mg/dL   VLDL Cholesterol Cal 21 5 - 40 mg/dL   LDL Calculated 478 (H) 0 - 99 mg/dL  TSH  Result Value Ref Range   TSH 2.330 0.450 - 4.500 uIU/mL  UA/M w/rflx Culture, Routine  Result Value Ref Range   Specific Gravity, UA 1.020 1.005 - 1.030   pH, UA 5.0 5.0 - 7.5   Color, UA Yellow Yellow   Appearance Ur Clear Clear   Leukocytes, UA Negative Negative   Protein, UA Negative Negative/Trace   Glucose, UA Negative Negative   Ketones, UA Negative Negative   RBC, UA Negative Negative   Bilirubin, UA Negative Negative   Urobilinogen, Ur 0.2 0.2 - 1.0 mg/dL   Nitrite, UA Negative Negative  HIV Antibody (routine testing w rflx)  Result Value Ref Range   HIV Screen 4th Generation wRfx Non Reactive Non Reactive  RPR  Result Value Ref Range   RPR Ser Ql Non Reactive Non Reactive  HSV(herpes simplex vrs) 1+2 ab-IgG   Result Value Ref Range   HSV 1 Glycoprotein G Ab, IgG <0.91 0.00 - 0.90 index   HSV 2 IgG, Type Spec 22.60 (H) 0.00 - 0.90 index  ABO AND RH   Result Value Ref Range   ABO Grouping A    Rh Factor Positive       Assessment & Plan:   Problem List Items Addressed This Visit      Cardiovascular and Mediastinum   Essential hypertension - Primary    Improved, suspect will continue to improve with the excellent lifestyle changes he's making. Continue to monitor, call with persistent abnormal readings. Continue lisinopril 10 mg.       Relevant Orders   CBC with Differential/Platelet (Completed)   Comprehensive metabolic panel (Completed)   TSH (Completed)   UA/M w/rflx Culture, Routine (Completed)   Migraines    Stable, continue current regimen        Respiratory   Asthma    Noting more frequent issues now that he's exercising more, will restart flovent and prn albuterol regimen      Relevant Medications   fluticasone (FLOVENT HFA) 44 MCG/ACT inhaler   albuterol (PROVENTIL HFA;VENTOLIN HFA) 108 (90 Base) MCG/ACT inhaler    Other Visit Diagnoses    Annual physical exam       Relevant Orders   Lipid Panel w/o Chol/HDL Ratio (Completed)   Pre-employment examination       Relevant Orders   ABO and RH   Encounter for screening for HIV       Relevant Orders   HIV Antibody (routine testing w rflx) (Completed)   Routine screening for STI (sexually transmitted infection)       Relevant Orders   RPR (Completed)   HSV(herpes simplex vrs) 1+2 ab-IgG (Completed)   GC/Chlamydia Probe Amp (Completed)       Discussed aspirin prophylaxis for myocardial infarction prevention and decision was it was not indicated  LABORATORY TESTING:  Health maintenance labs ordered today as discussed above.   The natural history of prostate cancer and ongoing controversy regarding screening and potential treatment outcomes of prostate cancer has been discussed with the patient. The meaning of a  false positive PSA and a false negative PSA has been discussed. He indicates understanding of the limitations of this screening test and wishes not to proceed with screening PSA testing.   IMMUNIZATIONS:   - Tdap: Tetanus  vaccination status reviewed: last tetanus booster within 10 years. - Influenza: Up to date  PATIENT COUNSELING:    Sexuality: Discussed sexually transmitted diseases, partner selection, use of condoms, avoidance of unintended pregnancy  and contraceptive alternatives.   Advised to avoid cigarette smoking.  I discussed with the patient that most people either abstain from alcohol or drink within safe limits (<=14/week and <=4 drinks/occasion for males, <=7/weeks and <= 3 drinks/occasion for females) and that the risk for alcohol disorders and other health effects rises proportionally with the number of drinks per week and how often a drinker exceeds daily limits.  Discussed cessation/primary prevention of drug use and availability of treatment for abuse.   Diet: Encouraged to adjust caloric intake to maintain  or achieve ideal body weight, to reduce intake of dietary saturated fat and total fat, to limit sodium intake by avoiding high sodium foods and not adding table salt, and to maintain adequate dietary potassium and calcium preferably from fresh fruits, vegetables, and low-fat dairy products.    stressed the importance of regular exercise  Injury prevention: Discussed safety belts, safety helmets, smoke detector, smoking near bedding or upholstery.   Dental health: Discussed importance of regular tooth brushing, flossing, and dental visits.   Follow up plan: NEXT PREVENTATIVE PHYSICAL DUE IN 1 YEAR. Return in about 6 months (around 03/18/2019) for BP.

## 2018-09-19 LAB — CBC WITH DIFFERENTIAL/PLATELET
BASOS ABS: 0 10*3/uL (ref 0.0–0.2)
Basos: 1 %
EOS (ABSOLUTE): 0.2 10*3/uL (ref 0.0–0.4)
EOS: 3 %
Hematocrit: 48.8 % (ref 37.5–51.0)
Hemoglobin: 15.8 g/dL (ref 13.0–17.7)
IMMATURE GRANS (ABS): 0 10*3/uL (ref 0.0–0.1)
Immature Granulocytes: 0 %
LYMPHS: 35 %
Lymphocytes Absolute: 2.1 10*3/uL (ref 0.7–3.1)
MCH: 27.7 pg (ref 26.6–33.0)
MCHC: 32.4 g/dL (ref 31.5–35.7)
MCV: 86 fL (ref 79–97)
MONOCYTES: 11 %
Monocytes Absolute: 0.7 10*3/uL (ref 0.1–0.9)
NEUTROS ABS: 3.1 10*3/uL (ref 1.4–7.0)
Neutrophils: 50 %
Platelets: 208 10*3/uL (ref 150–450)
RBC: 5.7 x10E6/uL (ref 4.14–5.80)
RDW: 14.3 % (ref 11.6–15.4)
WBC: 6.2 10*3/uL (ref 3.4–10.8)

## 2018-09-19 LAB — COMPREHENSIVE METABOLIC PANEL
ALT: 41 IU/L (ref 0–44)
AST: 21 IU/L (ref 0–40)
Albumin/Globulin Ratio: 1.7 (ref 1.2–2.2)
Albumin: 4.7 g/dL (ref 4.0–5.0)
Alkaline Phosphatase: 60 IU/L (ref 39–117)
BUN / CREAT RATIO: 21 — AB (ref 9–20)
BUN: 21 mg/dL — AB (ref 6–20)
Bilirubin Total: 0.3 mg/dL (ref 0.0–1.2)
CO2: 16 mmol/L — AB (ref 20–29)
CREATININE: 1.01 mg/dL (ref 0.76–1.27)
Calcium: 10.2 mg/dL (ref 8.7–10.2)
Chloride: 102 mmol/L (ref 96–106)
GFR, EST AFRICAN AMERICAN: 110 mL/min/{1.73_m2} (ref >59)
GFR, EST NON AFRICAN AMERICAN: 95 mL/min/{1.73_m2} (ref >59)
GLUCOSE: 102 mg/dL — AB (ref 65–99)
Globulin, Total: 2.8 g/dL (ref 1.5–4.5)
Potassium: 4.5 mmol/L (ref 3.5–5.2)
Sodium: 140 mmol/L (ref 134–144)
TOTAL PROTEIN: 7.5 g/dL (ref 6.0–8.5)

## 2018-09-19 LAB — LIPID PANEL W/O CHOL/HDL RATIO
Cholesterol, Total: 203 mg/dL — ABNORMAL HIGH (ref 100–199)
HDL: 36 mg/dL — ABNORMAL LOW (ref >39)
LDL CALC: 146 mg/dL — AB (ref 0–99)
Triglycerides: 105 mg/dL (ref 0–149)
VLDL Cholesterol Cal: 21 mg/dL (ref 5–40)

## 2018-09-19 LAB — HSV(HERPES SIMPLEX VRS) I + II AB-IGG
HSV 1 Glycoprotein G Ab, IgG: <0.91 index (ref 0.00–0.90)
HSV 2 IgG, Type Spec: 22.6 index — ABNORMAL HIGH (ref 0.00–0.90)

## 2018-09-19 LAB — GC/CHLAMYDIA PROBE AMP
Chlamydia trachomatis, NAA: NEGATIVE
NEISSERIA GONORRHOEAE BY PCR: NEGATIVE

## 2018-09-19 LAB — ABO AND RH: Rh Factor: POSITIVE

## 2018-09-19 LAB — RPR: RPR Ser Ql: NONREACTIVE

## 2018-09-19 LAB — HIV ANTIBODY (ROUTINE TESTING W REFLEX): HIV SCREEN 4TH GENERATION: NONREACTIVE

## 2018-09-19 LAB — TSH: TSH: 2.33 u[IU]/mL (ref 0.450–4.500)

## 2018-09-20 ENCOUNTER — Other Ambulatory Visit: Payer: Self-pay | Admitting: Family Medicine

## 2018-09-21 ENCOUNTER — Ambulatory Visit: Payer: 59 | Admitting: Family Medicine

## 2018-09-21 MED ORDER — BUDESONIDE-FORMOTEROL FUMARATE 80-4.5 MCG/ACT IN AERO: 2.0000 | INHALATION_SPRAY | Freq: Two times a day (BID) | RESPIRATORY_TRACT | 3 refills | Status: DC

## 2018-09-21 MED ORDER — LISINOPRIL 10 MG PO TABS: 10.0000 mg | ORAL_TABLET | Freq: Every day | ORAL | 1 refills | Status: DC

## 2018-09-21 NOTE — Telephone Encounter (Signed)
P.A. denied per [employer].  This medication is only covered if pt has failed or cannot sue all of the following: 1. Qvar Redihaler 2. [employer] 3. Asmanex TwistHaler ro Asmanex HFA  Please advise.

## 2018-09-21 NOTE — Telephone Encounter (Signed)
Appeals can be faxed to: [employer] Fax: [phone]

## 2018-09-21 NOTE — Addendum Note (Signed)
Addended by: Roosvelt Maser E on: 09/21/2018 02:58 PM   Modules accepted: Orders

## 2018-09-21 NOTE — Telephone Encounter (Signed)
Patient notified and verbalized understanding. 

## 2018-09-21 NOTE — Telephone Encounter (Signed)
Rx changed to symbicort. If not fully covered, he can download a savings card on the drug website for free inhalers

## 2018-10-26 ENCOUNTER — Other Ambulatory Visit: Payer: Self-pay

## 2018-10-26 ENCOUNTER — Encounter: Payer: Self-pay | Admitting: Nurse Practitioner

## 2018-10-26 ENCOUNTER — Ambulatory Visit (INDEPENDENT_AMBULATORY_CARE_PROVIDER_SITE_OTHER): Payer: 59 | Admitting: Nurse Practitioner

## 2018-10-26 ENCOUNTER — Encounter: Payer: Self-pay | Admitting: Family Medicine

## 2018-10-26 VITALS — BP 134/89 | HR 96 | Temp 98.8°F | Ht 78.0 in | Wt 284.0 lb

## 2018-10-26 DIAGNOSIS — R509 Fever, unspecified: Secondary | ICD-10-CM | POA: Diagnosis not present

## 2018-10-26 LAB — VERITOR FLU A/B WAIVED
INFLUENZA B: NEGATIVE
Influenza A: NEGATIVE

## 2018-10-26 NOTE — Progress Notes (Signed)
BP 134/89   Pulse 96   Temp 98.8 F (37.1 C) (Oral)   Ht  (1.981 m)   Wt 284 lb (128.8 kg)   SpO2 95%   BMI 32.82 kg/m    Subjective:    Patient ID: Brian Contreras, male    DOB: 01-28-1981, 38 y.o.   MRN: [id number]  HPI: Brian Contreras is a 38 y.o. male  Chief Complaint  Patient presents with  . Headache    symptoms started yesterday. tried OTC tylenol, ibuprofen  . Fever    100.7 this morning  . Chills  . Nausea    vomiting yesterday   UPPER RESPIRATORY TRACT INFECTION Started with symptoms yesterday.  Has not travelled outside country.  Has been around a friend with the flu recently.  His daughter was sick yesterday, along with him.  He works in [employer] every day.  Saw [facility] this morning and was prescribed Tamiflu and Tessalon.   Worst symptom: chills Fever: yes Cough: yes Shortness of breath: no Wheezing: no Chest pain: no Chest tightness: no Chest congestion: no Nasal congestion: no Runny nose: yes Post nasal drip: yes Sneezing: no Sore throat: no Swollen glands: no Sinus pressure: yes Headache: yes Face pain: no Toothache: no Ear pain: none Ear pressure: none Eyes red/itching:no Eye drainage/crusting: no  Vomiting: yes, yesterday, none today Rash: no Fatigue: yes Sick contacts: yes Strep contacts: no  Context: stable Recurrent sinusitis: no Relief with OTC cold/cough medications: yes  Treatments attempted: none   Relevant past medical, surgical, family and social history reviewed and updated as indicated. Interim medical history since our last visit reviewed. Allergies and medications reviewed and updated.  Review of Systems  Constitutional: Negative for activity change, diaphoresis, fatigue and fever.  Respiratory: Negative for cough, chest tightness, shortness of breath and wheezing.   Cardiovascular: Negative for chest pain, palpitations and leg swelling.  Gastrointestinal: Negative for abdominal distention,  abdominal pain, constipation, diarrhea, nausea and vomiting.  Endocrine: Negative for cold intolerance, heat intolerance, polydipsia, polyphagia and polyuria.  Musculoskeletal: Negative.   Skin: Negative.   Neurological: Negative for dizziness, syncope, weakness, light-headedness, numbness and headaches.  Psychiatric/Behavioral: Negative.     Per HPI unless specifically indicated above     Objective:    BP 134/89   Pulse 96   Temp 98.8 F (37.1 C) (Oral)   Ht  (1.981 m)   Wt 284 lb (128.8 kg)   SpO2 95%   BMI 32.82 kg/m   Wt Readings from Last 3 Encounters:  10/26/18 284 lb (128.8 kg)  09/17/18 287 lb 5 oz (130.3 kg)  08/20/18 295 lb 4 oz (133.9 kg)    Physical Exam Vitals signs and nursing note reviewed.  Constitutional:      Appearance: He is well-developed.  HENT:     Head: Normocephalic and atraumatic.     Right Ear: Hearing normal. No drainage.     Left Ear: Hearing normal. No drainage.     Mouth/Throat:     Pharynx: Uvula midline.  Eyes:     General: Lids are normal.        Right eye: No discharge.        Left eye: No discharge.     Conjunctiva/sclera: Conjunctivae normal.     Pupils: Pupils are equal, round, and reactive to light.  Neck:     Musculoskeletal: Normal range of motion and neck supple.     Thyroid: No thyromegaly.  Vascular: No carotid bruit or JVD.     Trachea: Trachea normal.  Cardiovascular:     Rate and Rhythm: Normal rate and regular rhythm.     Heart sounds: Normal heart sounds, S1 normal and S2 normal. No murmur. No gallop.   Pulmonary:     Effort: Pulmonary effort is normal.     Breath sounds: Normal breath sounds.  Abdominal:     General: Bowel sounds are normal.     Palpations: Abdomen is soft. There is no hepatomegaly or splenomegaly.  Musculoskeletal: Normal range of motion.     Right lower leg: No edema.     Left lower leg: No edema.  Skin:    General: Skin is warm and dry.     Capillary Refill: Capillary refill  takes less than 2 seconds.     Findings: No rash.  Neurological:     Mental Status: He is alert and oriented to person, place, and time.     Deep Tendon Reflexes: Reflexes are normal and symmetric.  Psychiatric:        Mood and Affect: Mood normal.        Behavior: Behavior normal.        Thought Content: Thought content normal.        Judgment: Judgment normal.     Results for orders placed or performed in visit on 09/17/18  GC/Chlamydia Probe Amp  Result Value Ref Range   Chlamydia trachomatis, NAA Negative Negative   Neisseria gonorrhoeae by PCR Negative Negative  CBC with Differential/Platelet  Result Value Ref Range   WBC 6.2 3.4 - 10.8 x10E3/uL   RBC 5.70 4.14 - 5.80 x10E6/uL   Hemoglobin 15.8 13.0 - 17.7 g/dL   Hematocrit 93.2 67.1 - 51.0 %   MCV 86 79 - 97 fL   MCH 27.7 26.6 - 33.0 pg   MCHC 32.4 31.5 - 35.7 g/dL   RDW 24.5 80.9 - 98.3 %   Platelets 208 150 - 450 x10E3/uL   Neutrophils 50 Not Estab. %   Lymphs 35 Not Estab. %   Monocytes 11 Not Estab. %   Eos 3 Not Estab. %   Basos 1 Not Estab. %   Neutrophils Absolute 3.1 1.4 - 7.0 x10E3/uL   Lymphocytes Absolute 2.1 0.7 - 3.1 x10E3/uL   Monocytes Absolute 0.7 0.1 - 0.9 x10E3/uL   EOS (ABSOLUTE) 0.2 0.0 - 0.4 x10E3/uL   Basophils Absolute 0.0 0.0 - 0.2 x10E3/uL   Immature Granulocytes 0 Not Estab. %   Immature Grans (Abs) 0.0 0.0 - 0.1 x10E3/uL  Comprehensive metabolic panel  Result Value Ref Range   Glucose 102 (H) 65 - 99 mg/dL   BUN 21 (H) 6 - 20 mg/dL   Creatinine, Ser 3.82 0.76 - 1.27 mg/dL   GFR calc non Af Amer 95 >59 mL/min/1.73   GFR calc Af Amer 110 >59 mL/min/1.73   BUN/Creatinine Ratio 21 (H) 9 - 20   Sodium 140 134 - 144 mmol/L   Potassium 4.5 3.5 - 5.2 mmol/L   Chloride 102 96 - 106 mmol/L   CO2 16 (L) 20 - 29 mmol/L   Calcium 10.2 8.7 - 10.2 mg/dL   Total Protein 7.5 6.0 - 8.5 g/dL   Albumin 4.7 4.0 - 5.0 g/dL   Globulin, Total 2.8 1.5 - 4.5 g/dL   Albumin/Globulin Ratio 1.7 1.2 - 2.2    Bilirubin Total 0.3 0.0 - 1.2 mg/dL   Alkaline Phosphatase 60 39 - 117 IU/L  AST 21 0 - 40 IU/L   ALT 41 0 - 44 IU/L  Lipid Panel w/o Chol/HDL Ratio  Result Value Ref Range   Cholesterol, Total 203 (H) 100 - 199 mg/dL   Triglycerides 142 0 - 149 mg/dL   HDL 36 (L) >39 mg/dL   VLDL Cholesterol Cal 21 5 - 40 mg/dL   LDL Calculated 532 (H) 0 - 99 mg/dL  TSH  Result Value Ref Range   TSH 2.330 0.450 - 4.500 uIU/mL  UA/M w/rflx Culture, Routine  Result Value Ref Range   Specific Gravity, UA 1.020 1.005 - 1.030   pH, UA 5.0 5.0 - 7.5   Color, UA Yellow Yellow   Appearance Ur Clear Clear   Leukocytes, UA Negative Negative   Protein, UA Negative Negative/Trace   Glucose, UA Negative Negative   Ketones, UA Negative Negative   RBC, UA Negative Negative   Bilirubin, UA Negative Negative   Urobilinogen, Ur 0.2 0.2 - 1.0 mg/dL   Nitrite, UA Negative Negative  HIV Antibody (routine testing w rflx)  Result Value Ref Range   HIV Screen 4th Generation wRfx Non Reactive Non Reactive  RPR  Result Value Ref Range   RPR Ser Ql Non Reactive Non Reactive  HSV(herpes simplex vrs) 1+2 ab-IgG  Result Value Ref Range   HSV 1 Glycoprotein G Ab, IgG <0.91 0.00 - 0.90 index   HSV 2 IgG, Type Spec 22.60 (H) 0.00 - 0.90 index  ABO AND RH   Result Value Ref Range   ABO Grouping A    Rh Factor Positive       Assessment & Plan:   Problem List Items Addressed This Visit    None    Visit Diagnoses    Fever, unspecified fever cause    -  Primary   Flu testing negative, but suspicious for flu.  Recommend continue Tamiflu and Tessalon.  Return if continued or worsening symptoms.   Relevant Orders   Veritor Flu A/B Waived       Follow up plan: Return if symptoms worsen or fail to improve.

## 2018-10-26 NOTE — Patient Instructions (Signed)

## 2018-12-03 ENCOUNTER — Ambulatory Visit (INDEPENDENT_AMBULATORY_CARE_PROVIDER_SITE_OTHER): Payer: 59 | Admitting: Family Medicine

## 2018-12-03 ENCOUNTER — Encounter: Payer: Self-pay | Admitting: Family Medicine

## 2018-12-03 ENCOUNTER — Other Ambulatory Visit: Payer: Self-pay

## 2018-12-03 ENCOUNTER — Telehealth: Payer: Self-pay

## 2018-12-03 VITALS — Ht 78.0 in | Wt 280.0 lb

## 2018-12-03 DIAGNOSIS — M545 Low back pain, unspecified: Secondary | ICD-10-CM

## 2018-12-03 MED ORDER — PREDNISONE 10 MG PO TABS: ORAL_TABLET | ORAL | 0 refills | Status: DC

## 2018-12-03 MED ORDER — TRAMADOL HCL 50 MG PO TABS: 50.0000 mg | ORAL_TABLET | Freq: Two times a day (BID) | ORAL | 0 refills | Status: DC | PRN

## 2018-12-03 NOTE — Progress Notes (Signed)
Ht 6\' 6"  (1.981 m)   Wt 280 lb (127 kg)   BMI 32.36 kg/m    Subjective:    Patient ID: Brian Contreras, male    DOB: 08/15/1980, 38 y.o.   MRN: [id number]  HPI: Brian HoveWilliam M Nesler is a 38 y.o. male  Chief Complaint  Patient presents with  . Back Pain    Ongoing 1 week. Patient symptoms have worsened over weekend. Called into work today because patient is unable to get out of bed.     . This visit was completed via WebEx due to the restrictions of the COVID-19 pandemic. All issues as above were discussed and addressed. Physical exam was done as above through visual confirmation on WebEx. If it was felt that the patient should be evaluated in the office, they were directed there. The patient verbally consented to this visit. . Location of the patient: home . Location of the provider: home . Those involved with this call:  . Provider: Roosvelt Maserachel Brissa Asante, PA-C . CMA: Myrtha MantisKeri Bullock, CMA . Front Desk/Registration: Harriet PhoJoliza Johnson  . Time spent on call: 15 minutes with patient face to face via video conference. More than 50% of this time was spent in counseling and coordination of care. 5 minutes total spent in review of patient's record and preparation of their chart.  I verified patient identity using two factors (patient name and date of birth). Patient consents verbally to being seen via telemedicine visit today.   Patient presenting with 1 week of significant low back pain. Seemed to start up after he twisted to get his work gear off about a week ago. 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 Trying heat, ice, massage, rest with minimal relief. Was seen a day or so after onset at employee clinic and given a shot of toradol and kenalog as well as meloxicam  And flexeril which so far have not provided significant relief. Denies radiation of pain,  numbness, tingling, weakness of legs, incontinence issues, fevers, saddle paresthesias.    Relevant past medical, surgical, family and social history reviewed and updated as indicated. Interim medical history since our last visit reviewed. Allergies and medications reviewed and updated.  Review of Systems  Per HPI unless specifically indicated above     Objective:    Ht 6\' 6"  (1.981 m)   Wt 280 lb (127 kg)   BMI 32.36 kg/m   Wt Readings from Last 3 Encounters:  12/03/18 280 lb (127 kg)  10/26/18 284 lb (128.8 kg)  09/17/18 287 lb 5 oz (130.3 kg)    Physical Exam Vitals signs and nursing note reviewed.  Constitutional:      General: He is not in acute distress.    Appearance: Normal appearance.  HENT:     Head: Atraumatic.     Right Ear: External ear normal.     Left Ear: External ear normal.     Mouth/Throat:     Mouth: Mucous membranes are moist.     Pharynx: Oropharynx is clear.  Eyes:     Extraocular Movements: Extraocular movements intact.     Conjunctiva/sclera: Conjunctivae normal.  Neck:     Musculoskeletal: Normal range of motion.  Pulmonary:     Effort: Pulmonary effort is normal. No respiratory distress.  Musculoskeletal:     Comments: Pt  laying on back in bed, limited ability to examine even visually through video capabilities due to patient discomfort with moving. Antalgic motions.   Skin:    General: Skin is dry.     Findings: No erythema or rash.  Neurological:     Mental Status: He is oriented to person, place, and time.  Psychiatric:        Mood and Affect: Mood normal.        Thought Content: Thought content normal.        Judgment: Judgment normal.     Results for orders placed or performed in visit on 10/26/18  Veritor Flu A/B Waived  Result Value Ref Range   Influenza A Negative Negative   Influenza B Negative Negative      Assessment & Plan:   Problem List Items Addressed This Visit    None    Visit Diagnoses    Acute bilateral  low back pain without sciatica    -  Primary   Start prednisone taper as he's not improving on multiple agents. Continue flexeril, stretches, heat, rest. Tramadol for rare use for severe pain.    Relevant Medications   meloxicam (MOBIC) 15 MG tablet   cyclobenzaprine (FLEXERIL) 10 MG tablet   predniSONE (DELTASONE) 10 MG tablet   traMADol (ULTRAM) 50 MG tablet       Follow up plan: Return if symptoms worsen or fail to improve.

## 2018-12-03 NOTE — Telephone Encounter (Signed)
Called and spoke to [employer]. Provided Spero Geralds license number as requested.

## 2019-07-01 ENCOUNTER — Emergency Department
Admission: EM | Admit: 2019-07-01 | Discharge: 2019-07-01 | Disposition: A | Discharge status: Home / Self Care | Payer: PRIVATE HEALTH INSURANCE | Attending: Emergency Medicine | Admitting: Emergency Medicine

## 2019-07-01 ENCOUNTER — Encounter: Payer: Self-pay | Admitting: Emergency Medicine

## 2019-07-01 ENCOUNTER — Emergency Department: Payer: PRIVATE HEALTH INSURANCE

## 2019-07-01 ENCOUNTER — Other Ambulatory Visit: Payer: Self-pay

## 2019-07-01 ENCOUNTER — Ambulatory Visit: Payer: Self-pay | Admitting: Family Medicine

## 2019-07-01 DIAGNOSIS — J45909 Unspecified asthma, uncomplicated: Secondary | ICD-10-CM | POA: Insufficient documentation

## 2019-07-01 DIAGNOSIS — I10 Essential (primary) hypertension: Secondary | ICD-10-CM | POA: Diagnosis not present

## 2019-07-01 DIAGNOSIS — R5383 Other fatigue: Secondary | ICD-10-CM | POA: Diagnosis not present

## 2019-07-01 DIAGNOSIS — R55 Syncope and collapse: Secondary | ICD-10-CM | POA: Diagnosis not present

## 2019-07-01 DIAGNOSIS — Z20828 Contact with and (suspected) exposure to other viral communicable diseases: Secondary | ICD-10-CM | POA: Insufficient documentation

## 2019-07-01 DIAGNOSIS — Z79899 Other long term (current) drug therapy: Secondary | ICD-10-CM | POA: Insufficient documentation

## 2019-07-01 LAB — COMPREHENSIVE METABOLIC PANEL
ALT: 31 U/L (ref 0–44)
AST: 24 U/L (ref 15–41)
Albumin: 4 g/dL (ref 3.5–5.0)
Alkaline Phosphatase: 48 U/L (ref 38–126)
Anion gap: 10 (ref 5–15)
BUN: 15 mg/dL (ref 6–20)
CO2: 22 mmol/L (ref 22–32)
Calcium: 9.2 mg/dL (ref 8.9–10.3)
Chloride: 108 mmol/L (ref 98–111)
Creatinine, Ser: 0.93 mg/dL (ref 0.61–1.24)
GFR calc Af Amer: >60 mL/min (ref >60)
GFR calc non Af Amer: >60 mL/min (ref >60)
Glucose, Bld: 181 mg/dL — ABNORMAL HIGH (ref 70–99)
Potassium: 3.8 mmol/L (ref 3.5–5.1)
Sodium: 140 mmol/L (ref 135–145)
Total Bilirubin: 0.6 mg/dL (ref 0.3–1.2)
Total Protein: 7.3 g/dL (ref 6.5–8.1)

## 2019-07-01 LAB — URINALYSIS, COMPLETE (UACMP) WITH MICROSCOPIC
Bacteria, UA: NONE SEEN
Bilirubin Urine: NEGATIVE
Glucose, UA: NEGATIVE mg/dL
Hgb urine dipstick: NEGATIVE
Ketones, ur: NEGATIVE mg/dL
Leukocytes,Ua: NEGATIVE
Nitrite: NEGATIVE
Protein, ur: NEGATIVE mg/dL
Specific Gravity, Urine: 1.014 (ref 1.005–1.030)
WBC, UA: NONE SEEN WBC/hpf (ref 0–5)
pH: 5 (ref 5.0–8.0)

## 2019-07-01 LAB — CBC
HCT: 45.6 % (ref 39.0–52.0)
Hemoglobin: 15.5 g/dL (ref 13.0–17.0)
MCH: 27.9 pg (ref 26.0–34.0)
MCHC: 34 g/dL (ref 30.0–36.0)
MCV: 82 fL (ref 80.0–100.0)
Platelets: 193 10*3/uL (ref 150–400)
RBC: 5.56 MIL/uL (ref 4.22–5.81)
RDW: 13.3 % (ref 11.5–15.5)
WBC: 5.5 10*3/uL (ref 4.0–10.5)
nRBC: 0 % (ref 0.0–0.2)

## 2019-07-01 NOTE — ED Provider Notes (Signed)
[facility] Emergency Department Provider Note  Time seen: 1:12 PM  I have reviewed the triage vital signs and the nursing notes.   HISTORY  Chief Complaint Loss of Consciousness   HPI Brian Contreras is a 38 y.o. male with a past medical history of asthma, migraines, presents to the emergency department after syncopal episode yesterday.  According to the patient over the past 2 days he has not been feeling well he describes headache body aches generalized fatigue and weakness.  Low-grade temperature in the emergency department 99.2.  Patient states he got up from the bathroom yesterday felt lightheaded and fell to the ground with either a brief syncopal or near syncopal episode was diaphoretic at that time EMS was called but the patient deferred transport.  Patient states he wanted to go to work today but his work made him get a return to work note.  He called his doctor who referred him to the emergency department.  Here the patient appears well continues to state mild to moderate headache but states it has been coming and going.  His main complaint is just feeling generalized fatigue and weakness has had an occasional dry cough.  Body aches.  Denies any chest pain or shortness of breath at any point.   Past Medical History:  Diagnosis Date  . Asthma   . Kidney stone   . Migraines     Patient Active Problem List   Diagnosis Date Noted  . Asthma 09/17/2018  . Class 2 severe obesity due to excess calories with serious comorbidity and body mass index (BMI) of 35.0 to 35.9 in adult (HCC) 08/23/2018  . Migraines 08/23/2018  . Essential hypertension 08/20/2018    Past Surgical History:  Procedure Laterality Date  . ADENOIDECTOMY    . arm surgery Bilateral   . TONSILLECTOMY    . TYMPANOSTOMY TUBE PLACEMENT    . WISDOM TOOTH EXTRACTION      Prior to Admission medications   Medication Sig Start Date End Date Taking? Authorizing Provider  albuterol  (PROVENTIL HFA;VENTOLIN HFA) 108 (90 Base) MCG/ACT inhaler Inhale 2 puffs into the lungs every 6 (six) hours as needed for wheezing or shortness of breath. 09/17/18   Particia Nearing, PA-C  cyclobenzaprine (FLEXERIL) 10 MG tablet Take 10 mg by mouth 2 (two) times daily as needed. 11/12/18   [provider]  lisinopril (PRINIVIL,ZESTRIL) 10 MG tablet Take 1 tablet (10 mg total) by mouth daily. 09/21/18   Particia Nearing, PA-C  meloxicam [facility]) 15 MG tablet  12/02/18   [provider]  predniSONE (DELTASONE) 10 MG tablet Take 6 tabs day one, 5 tabs day two, 4 tabs day three, etc 12/03/18   Particia Nearing, PA-C  SUMAtriptan (IMITREX) 100 MG tablet May repeat in 2 hours if headache persists or recurs. 08/20/18   Particia Nearing, PA-C  traMADol (ULTRAM) 50 MG tablet Take 1 tablet (50 mg total) by mouth 2 (two) times daily as needed. 12/03/18   Particia Nearing, PA-C    Allergies  Allergen Reactions  . Amoxicillin Swelling  . Ceclor [Cefaclor] Other (See Comments)  . Penicillins Swelling  . Sulfa Antibiotics Other (See Comments)    Family History  Problem Relation Age of Onset  . Arthritis Mother   . Diabetes Mother   . Kidney disease Mother   . Migraines Mother   . Arthritis Father   . Arthritis Brother   . Torticollis Daughter   . Kidney  disease Daughter        Reflux  . Diabetes Maternal Grandmother   . Hypertension Maternal Grandmother   . Heart attack Maternal Grandfather   . Cancer Paternal Grandmother   . Hypertension Paternal Grandfather   . Hyperlipidemia Paternal Grandfather     Social History Social History   Tobacco Use  . Smoking status: Never Smoker  . Smokeless tobacco: Never Used  Substance Use Topics  . Alcohol use: Yes    Comment: occasional  . Drug use: No    Review of Systems Constitutional: Negative for fever.  Positive for weakness Cardiovascular: Negative for chest pain. Respiratory: Negative for shortness  of breath. Gastrointestinal: Negative for abdominal pain, vomiting Musculoskeletal: Positive for body aches Neurological: Negative for headache All other ROS negative  ____________________________________________   PHYSICAL EXAM:  VITAL SIGNS: ED Triage Vitals  Enc Vitals Group     BP 07/01/19 1147 (!) 139/91     Pulse Rate 07/01/19 1147 93     Resp 07/01/19 1147 19     Temp 07/01/19 1147 99.2 F (37.3 C)     Temp Source 07/01/19 1147 Oral     SpO2 07/01/19 1147 98 %     Weight 07/01/19 1145 280 lb (127 kg)     Height 07/01/19 1145 6\' 6"  (1.981 m)     Head Circumference --      Peak Flow --      Pain Score 07/01/19 1144 3     Pain Loc --      Pain Edu? --      Excl. in [place]? --    Constitutional: Alert and oriented. Well appearing and in no distress. Eyes: Normal exam ENT      Head: Normocephalic and atraumatic.      Mouth/Throat: Mucous membranes are moist. Cardiovascular: Normal rate, regular rhythm. No murmur Respiratory: Normal respiratory effort without tachypnea nor retractions. Breath sounds are clear  Gastrointestinal: Soft and nontender. No distention.   Musculoskeletal: Nontender with normal range of motion in all extremities.  Neurologic:  Normal speech and language. No gross focal neurologic deficits  Skin:  Skin is warm, dry and intact.  Psychiatric: Mood and affect are normal.   ____________________________________________    EKG  EKG viewed and interpreted by myself shows a normal sinus rhythm at 93 bpm with a narrow QRS, normal axis, normal intervals, no concerning ST changes.  ____________________________________________    RADIOLOGY  CT head negative  ____________________________________________   INITIAL IMPRESSION / ASSESSMENT AND PLAN / ED COURSE  Pertinent labs & imaging results that were available during my care of the patient were reviewed by me and considered in my medical decision making (see chart for details).   Patient  presents to the emergency department for generalized fatigue weakness, syncopal episode yesterday intermittent headaches body aches nausea and occasional dry cough.  Patient initially denies cough however had a dry cough occasionally during my exam.  Overall the patient appears well no distress.  No meningismus.  Possible viral process.  We will swab for Covid.  Patient's lab work is largely reassuring with a normal-appearing EKG and a negative head CT.  I discussed with the patient supportive care at home including plenty of fluids rest and not to return to work until his Covid test has resulted.  Patient agreeable to plan of care.  Discussed my normal syncope return precautions.  Brian Contreras was evaluated in Emergency Department on 07/01/2019 for the symptoms described in the history  of present illness. He was evaluated in the context of the global COVID-19 pandemic, which necessitated consideration that the patient might be at risk for infection with the SARS-CoV-2 virus that causes COVID-19. Institutional protocols and algorithms that pertain to the evaluation of patients at risk for COVID-19 are in a state of rapid change based on information released by regulatory bodies including the CDC and federal and state organizations. These policies and algorithms were followed during the patient's care in the ED.  ____________________________________________   FINAL CLINICAL IMPRESSION(S) / ED DIAGNOSES  Syncope Fatigue   Minna AntisPaduchowski, [place] Capers, MD 07/01/19 1316

## 2019-07-01 NOTE — Telephone Encounter (Signed)
  Pt reports "Felt really bad yesterday." Generalized weakness, increased fatigue, body aches, afebrile. States passed out walking to [place]. Wife called EMS. BP via EMS sitting 152/102  Standing 138/91.  Unsure of HR.   EMS advised transport to ED, pt declined.    Pt states presently dizzy, headache 3-4/10 (Worse yesterday 6-7/10) Continues with body aches, generalized fatigue and headache, dizziness. Advised ED, pt verbalizes understanding, wife will drive. CAre advise given per protocol.  Reason for Disposition . SEVERE dizziness (e.g., unable to stand, requires support to walk, feels like passing out now)    Passed out last night; refused transport to ED, EMS saw  Answer Assessment - Initial Assessment Questions 1. DESCRIPTION: "Describe your dizziness."     Lightheadednesss 2. LIGHTHEADED: "Do you feel lightheaded?" (e.g., somewhat faint, woozy, weak upon standing)    yes 3. VERTIGO: "Do you feel like either you or the room is spinning or tilting?" (i.e. vertigo)      4. SEVERITY: "How bad is it?"  "Do you feel like you are going to faint?" "Can you stand and walk?"   - MILD - walking normally   - MODERATE - interferes with normal activities (e.g., work, school)    - SEVERE - unable to stand, requires support to walk, feels like passing out now.      Severe, passed out last night 5. ONSET:  "When did the dizziness begin?"     Yesterday 6. AGGRAVATING FACTORS: "Does anything make it worse?" (e.g., standing, change in head position)    orthostatic per EMS 7. HEART RATE: "Can you tell me your heart rate?" "How many beats in 15 seconds?"  (Note: not all patients can do this)        8. CAUSE: "What do you think is causing the dizziness?"     Unsure 9. RECURRENT SYMPTOM: "Have you had dizziness before?" If so, ask: "When was the last time?" "What happened that time?"     no 10. OTHER SYMPTOMS: "Do you have any other symptoms?" (e.g., fever, chest pain, vomiting, diarrhea, bleeding)  Body aches, weakness, increased fatigue, headache 3-4/10 presently, was more severe last night. BP now 152/102  Protocols used: DIZZINESS [facility]

## 2019-07-01 NOTE — ED Notes (Addendum)
Pt laying in bed speaking with this RN in NAD, A&Ox4. Reports not feeling well yesterday with HA and drowsiness and later passed out. Family called EMS but they he did not want to come to the ER. PCP recommended that he come today to be seen. Speech clear.

## 2019-07-01 NOTE — Discharge Instructions (Signed)
As we discussed please return to the emergency department for any further syncopal events, worsening headache, trouble breathing or development of chest pain.  Please drink plenty of fluids and obtain plenty of rest.  Please do not return to work until you have your Covid results.

## 2019-07-01 NOTE — ED Triage Notes (Signed)
Pt here for malaise feeling since yesterday. C/o nagging headache yesterday; hx migraines but this is different.  Describes as not severe but noticeable and has not gone away. Left dinner with family early last night because still wasn't feeling well and he had syncopal episode and family found him on floor.  Today pt still c/o nagging headache and not feeling right. No fever. Ambulatory. VSS

## 2019-07-02 LAB — SARS CORONAVIRUS 2 (TAT 6-24 HRS): SARS Coronavirus 2: NEGATIVE

## 2019-11-11 ENCOUNTER — Ambulatory Visit: Payer: PRIVATE HEALTH INSURANCE

## 2019-11-18 ENCOUNTER — Ambulatory Visit: Payer: PRIVATE HEALTH INSURANCE | Attending: Internal Medicine

## 2019-11-18 DIAGNOSIS — Z23 Encounter for immunization: Secondary | ICD-10-CM

## 2019-11-18 NOTE — Progress Notes (Signed)
   Covid-19 Vaccination Clinic  Name:  Brian Contreras    MRN: [id number] DOB: 1980-10-19  11/18/2019  Mr. Lightsey was observed post Covid-19 immunization for 30 minutes based on pre-vaccination screening without incident. He was provided with Vaccine Information Sheet and instruction to access the V-Safe system.   Mr. Klabunde was instructed to call 911 with any severe reactions post vaccine: Marland Kitchen Difficulty breathing  . Swelling of face and throat  . A fast heartbeat  . A bad rash all over body  . Dizziness and weakness   Immunizations Administered    Name Date Dose VIS Date Route   Pfizer COVID-19 Vaccine 11/18/2019 10:49 AM 0.3 mL 07/26/2019 Intramuscular   Manufacturer: [employer], [employer]   Lot: [id number]   NDC: [address]-3

## 2019-12-10 ENCOUNTER — Ambulatory Visit: Payer: PRIVATE HEALTH INSURANCE

## 2019-12-17 ENCOUNTER — Ambulatory Visit: Payer: PRIVATE HEALTH INSURANCE | Attending: Internal Medicine

## 2019-12-17 DIAGNOSIS — Z23 Encounter for immunization: Secondary | ICD-10-CM

## 2019-12-17 NOTE — Progress Notes (Signed)
   Covid-19 Vaccination Clinic  Name:  Brian Contreras    MRN: [id number] DOB: 1981/06/23  12/17/2019  [name] was observed post Covid-19 immunization for 15 minutes without incident. He was provided with Vaccine Information Sheet and instruction to access the V-Safe system.   [name] was instructed to call 911 with any severe reactions post vaccine: Marland Kitchen Difficulty breathing  . Swelling of face and throat  . A fast heartbeat  . A bad rash all over body  . Dizziness and weakness   Immunizations Administered    Name Date Dose VIS Date Route   Pfizer COVID-19 Vaccine 12/17/2019  8:11 AM 0.3 mL 10/09/2018 Intramuscular   Manufacturer: [employer], [employer]   Lot: [id number]   NDC: [address]-4

## 2020-03-09 IMAGING — CT CT HEAD W/O CM
3 series · 15 of 47 positions shown, 18 images · non-contrast
Comparison: Head CT 11/11/2013

CLINICAL DATA: Headache, acute, normal neuro exam. Additional
history provided: Generalized weakness, increased fatigue, body
aches, afebrile. Patient reports syncope.

EXAM:
CT HEAD WITHOUT CONTRAST
TECHNIQUE: Contiguous axial images were obtained from the base of the skull
through the vertex without intravenous contrast.

[Series 3: head wo · axial · 0.47mm/px · z∈[-127,+8]mm · 9 of 33 slices shown, 12 images]
[im 3/33  brain]
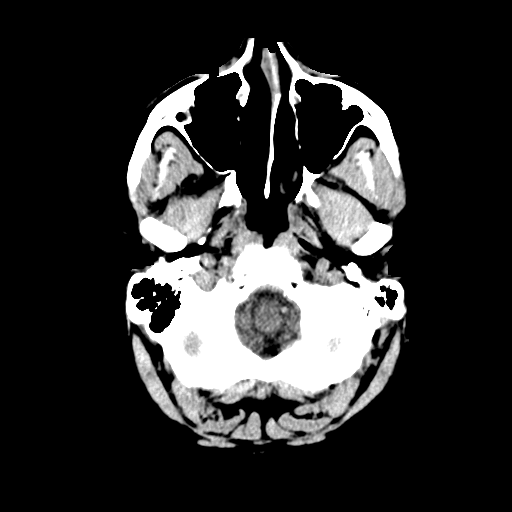
[im 3/33  bone]
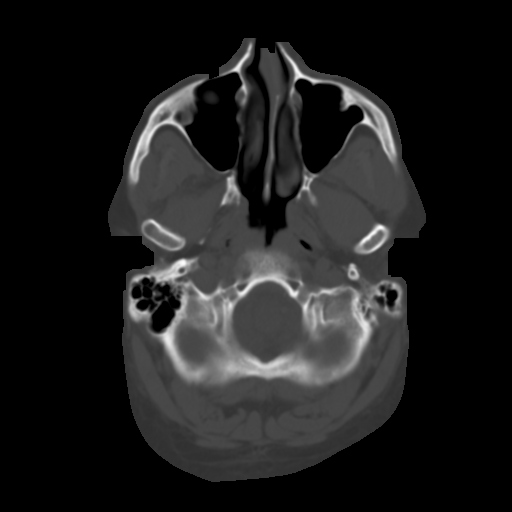
[im 6/33  brain]
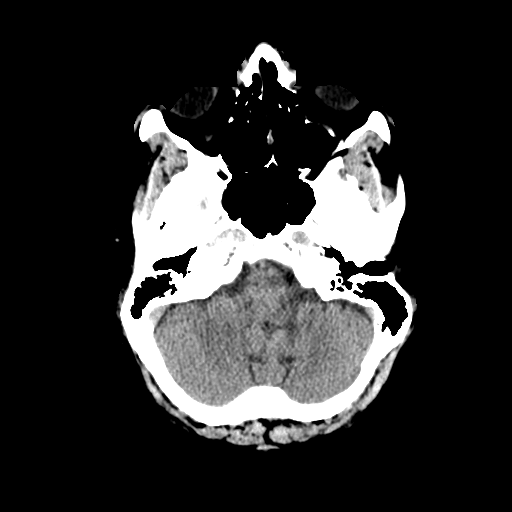
[im 9/33  brain]
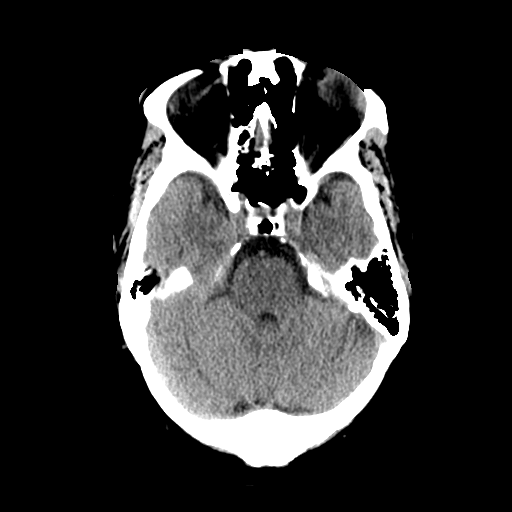
[im 13/33  brain]
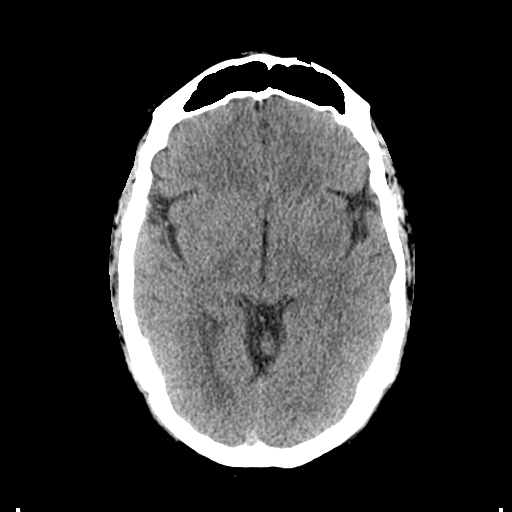
[im 17/33  brain]
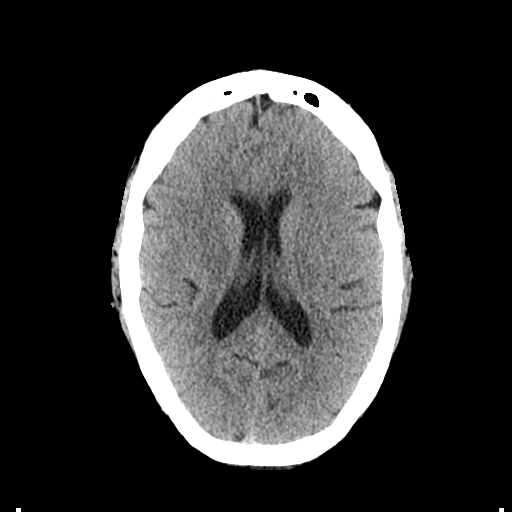
[im 17/33  bone]
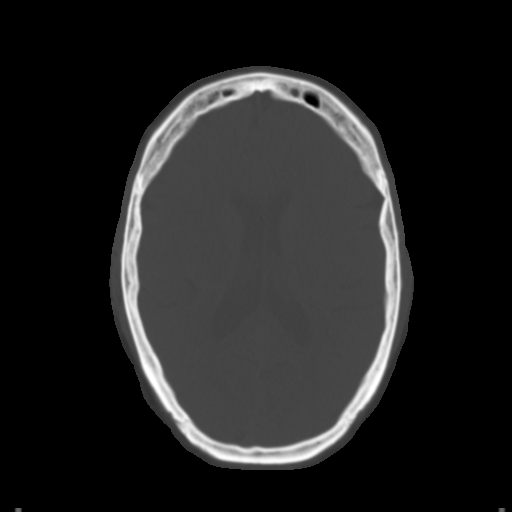
[im 20/33  brain]
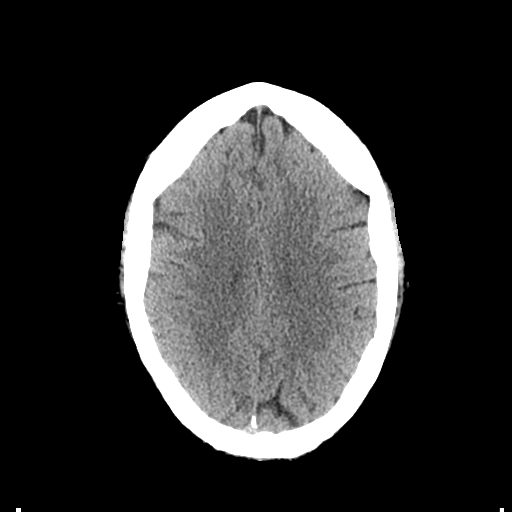
[im 24/33  brain]
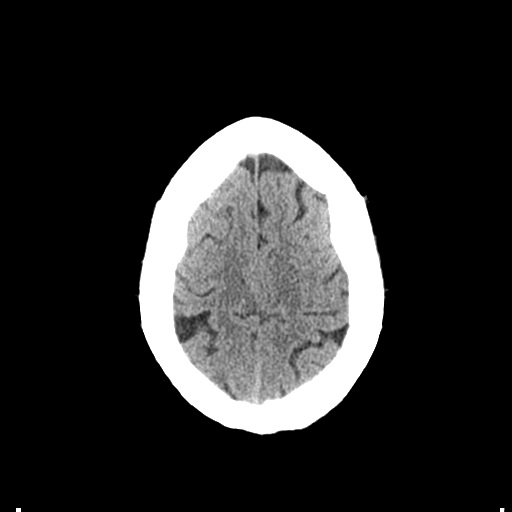
[im 27/33  brain]
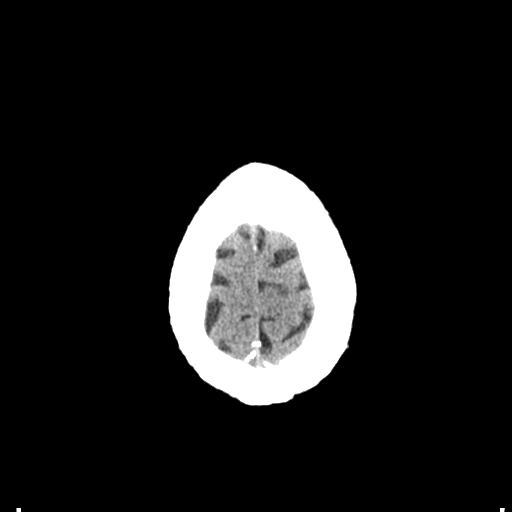
[im 30/33  brain]
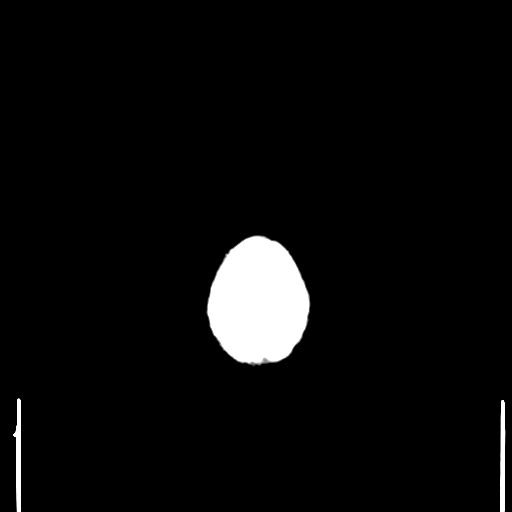
[im 30/33  bone]
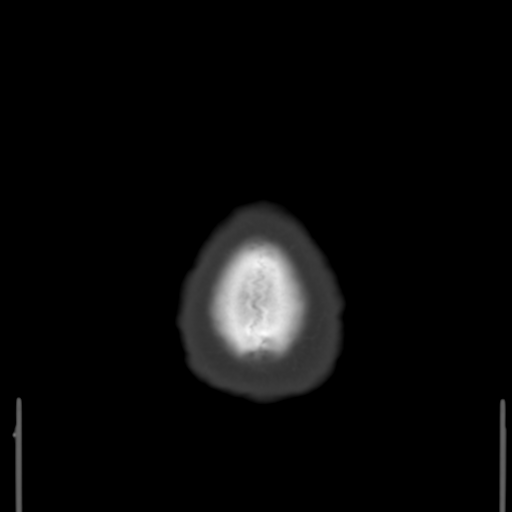

[Series 4: coronal soft tissue · coronal · 0.33mm/px · 3 of 66 slices shown]
[im 22/66  brain]
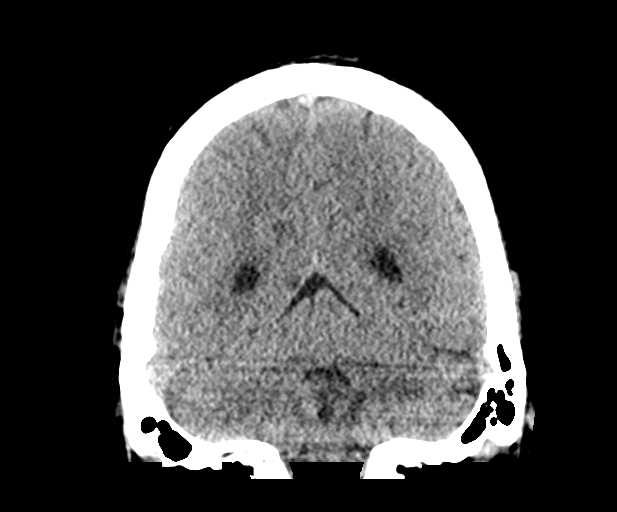
[im 29/66  brain]
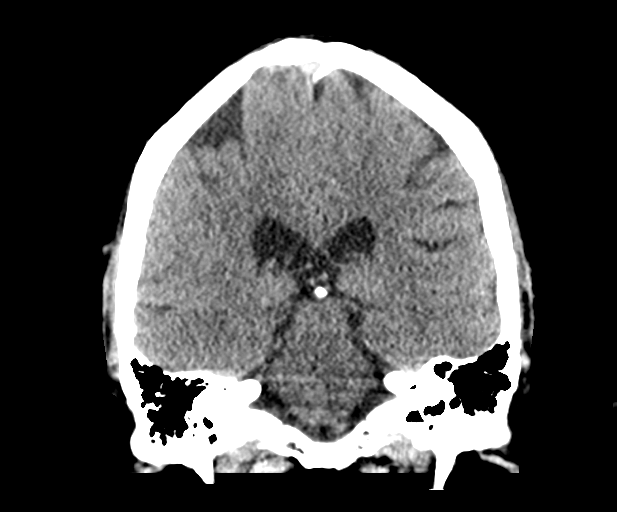
[im 37/66  brain]
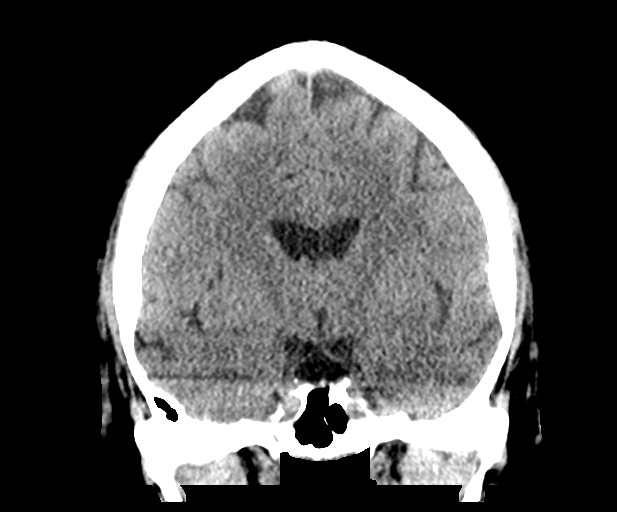

[Series 5: sagittal soft tissue · sagittal · 0.33mm/px · 3 of 52 slices shown]
[im 18/52  brain]
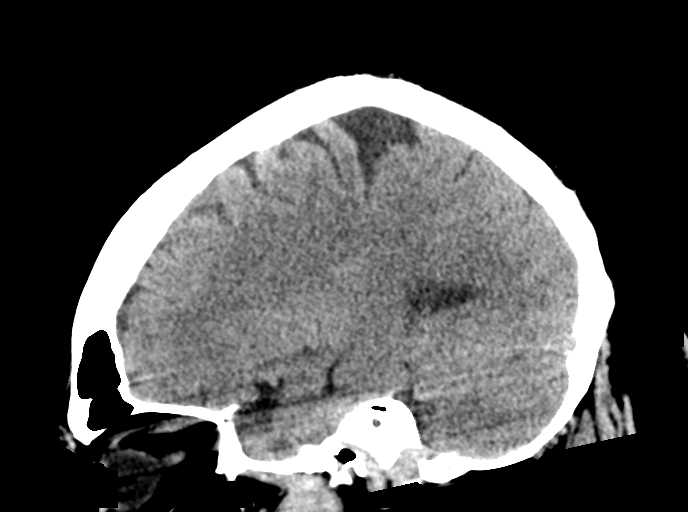
[im 26/52  brain]
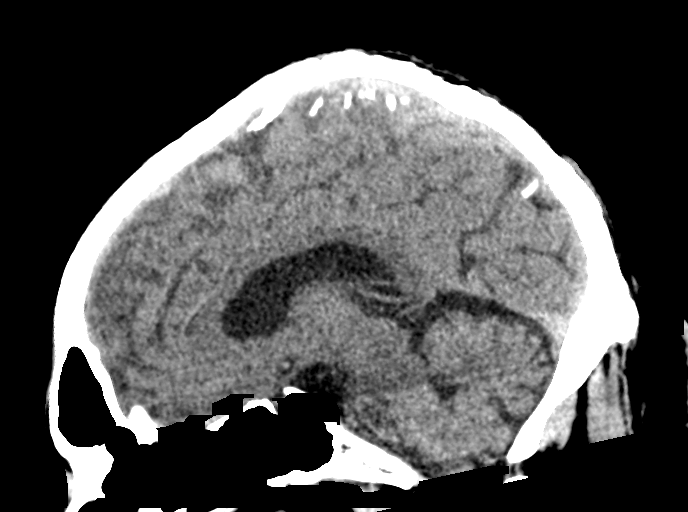
[im 35/52  brain]
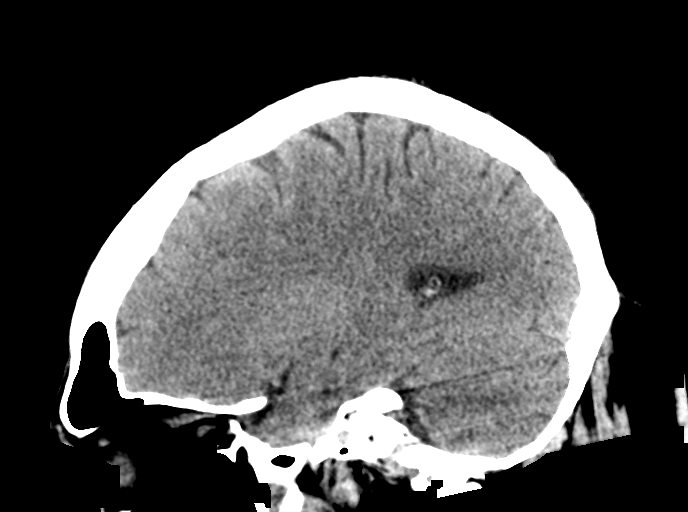

[15 of 47 positions shown; findings below may reference images not displayed]

FINDINGS: Brain:

No evidence of acute intracranial hemorrhage.

No demarcated cortical infarction.

No evidence of intracranial mass.

No midline shift or extra-axial fluid collection.

Cerebral volume is normal.

Vascular: No hyperdense vessel.

Skull: Normal. Negative for fracture or focal lesion.

Sinuses/Orbits: Tiny right maxillary sinus mucous retention cyst.
Visualized orbits demonstrate no acute abnormality.
IMPRESSION: No CT evidence of acute intracranial abnormality.

## 2020-03-31 ENCOUNTER — Ambulatory Visit (INDEPENDENT_AMBULATORY_CARE_PROVIDER_SITE_OTHER): Payer: PRIVATE HEALTH INSURANCE | Admitting: Nurse Practitioner

## 2020-03-31 ENCOUNTER — Other Ambulatory Visit: Payer: Self-pay

## 2020-03-31 ENCOUNTER — Encounter: Payer: Self-pay | Admitting: Nurse Practitioner

## 2020-03-31 VITALS — BP 118/80 | HR 82 | Temp 99.2°F | Ht 78.0 in | Wt 287.0 lb

## 2020-03-31 DIAGNOSIS — M545 Low back pain, unspecified: Secondary | ICD-10-CM

## 2020-03-31 DIAGNOSIS — N529 Male erectile dysfunction, unspecified: Secondary | ICD-10-CM | POA: Diagnosis not present

## 2020-03-31 MED ORDER — CYCLOBENZAPRINE HCL 10 MG PO TABS: 10.0000 mg | ORAL_TABLET | Freq: Two times a day (BID) | ORAL | 0 refills | Status: DC | PRN

## 2020-03-31 MED ORDER — TRAMADOL HCL 50 MG PO TABS: 50.0000 mg | ORAL_TABLET | Freq: Two times a day (BID) | ORAL | 0 refills | Status: DC | PRN

## 2020-03-31 MED ORDER — SILDENAFIL CITRATE 50 MG PO TABS: 50.0000 mg | ORAL_TABLET | Freq: Every day | ORAL | 2 refills | Status: DC | PRN

## 2020-03-31 NOTE — Assessment & Plan Note (Signed)
Acute, ongoing.  Likely lumbar strain due to heavy lifting over weekend.  PDMP reviewed and appropriate, will start flexeril to be used at night and tramadol 50 mg q12 hours as needed for severe pain #10.  If pain persists, patient to return to clinic.

## 2020-03-31 NOTE — Assessment & Plan Note (Addendum)
Acute, ongoing.  Possibly due to increase in stress for the past few months.  Will start trial of sildenafil and monitor for effectiveness.  May consider further testing in future with HbA1c and PSA if problem persists.

## 2020-03-31 NOTE — Patient Instructions (Signed)
Lumbar Strain A lumbar strain, which is sometimes called a low-back strain, is a stretch or tear in a muscle or the strong cords of tissue that attach muscle to bone (tendons) in the lower back (lumbar spine). This type of injury occurs when muscles or tendons are torn or are stretched beyond their limits. Lumbar strains can range from mild to severe. Mild strains may involve stretching a muscle or tendon without tearing it. These may heal in 1-2 weeks. More severe strains involve tearing of muscle fibers or tendons. These will cause more pain and may take 6-8 weeks to heal. What are the causes? This condition may be caused by:  Trauma, such as a fall or a hit to the body.  Twisting or overstretching the back. This may result from doing activities that need a lot of energy, such as lifting heavy objects. What increases the risk? This injury is more common in:  Athletes.  People with obesity.  People who do repeated lifting, bending, or other movements that involve their back. What are the signs or symptoms? Symptoms of this condition may include:  Sharp or dull pain in the lower back that does not go away. The pain may extend to the buttocks.  Stiffness or limited range of motion.  Sudden muscle tightening (spasms). How is this diagnosed? This condition may be diagnosed based on:  Your symptoms.  Your medical history.  A physical exam.  Imaging tests, such as: ? X-rays. ? MRI. How is this treated? Treatment for this condition may include:  Rest.  Applying heat and cold to the affected area.  Over-the-counter medicines to help relieve pain and inflammation, such as NSAIDs.  Prescription pain medicine and muscle relaxants may be needed for a short time.  Physical therapy. Follow these instructions at home: Managing pain, stiffness, and swelling      If directed, put ice on the injured area during the first 24 hours after your injury. ? Put ice in a plastic  bag. ? Place a towel between your skin and the bag. ? Leave the ice on for 20 minutes, 2-3 times a day.  If directed, apply heat to the affected area as often as told by your health care provider. Use the heat source that your health care provider recommends, such as a moist heat pack or a heating pad. ? Place a towel between your skin and the heat source. ? Leave the heat on for 20-30 minutes. ? Remove the heat if your skin turns bright red. This is especially important if you are unable to feel pain, heat, or cold. You may have a greater risk of getting burned. Activity  Rest and return to your normal activities as told by your health care provider. Ask your health care provider what activities are safe for you.  Do exercises as told by your health care provider. Medicines  Take over-the-counter and prescription medicines only as told by your health care provider.  Ask your health care provider if the medicine prescribed to you: ? Requires you to avoid driving or using heavy machinery. ? Can cause constipation. You may need to take these actions to prevent or treat constipation:  Drink enough fluid to keep your urine pale yellow.  Take over-the-counter or prescription medicines.  Eat foods that are high in fiber, such as beans, whole grains, and fresh fruits and vegetables.  Limit foods that are high in fat and processed sugars, such as fried or sweet foods. Injury prevention To prevent   a future low-back injury:  Always warm up properly before physical activity or sports.  Cool down and stretch after being active.  Use correct form when playing sports and lifting heavy objects. Bend your knees before you lift heavy objects.  Use good posture when sitting and standing.  Stay physically fit and keep a healthy weight. ? Do at least 150 minutes of moderate-intensity exercise each week, such as brisk walking or water aerobics. ? Do strength exercises at least 2 times each  week.  General instructions  Do not use any products that contain nicotine or tobacco, such as cigarettes, e-cigarettes, and chewing tobacco. If you need help quitting, ask your health care provider.  Keep all follow-up visits as told by your health care provider. This is important. Contact a health care provider if:  Your back pain does not improve after 6 weeks of treatment.  Your symptoms get worse. Get help right away if:  Your back pain is severe.  You are unable to stand or walk.  You develop pain in your legs.  You develop weakness in your buttocks or legs.  You have difficulty controlling when you urinate or when you have a bowel movement. ? You have frequent, painful, or bloody urination. ? You have a temperature over 101.0F (38.3C) Summary  A lumbar strain, which is sometimes called a low-back strain, is a stretch or tear in a muscle or the strong cords of tissue that attach muscle to bone (tendons) in the lower back (lumbar spine).  This type of injury occurs when muscles or tendons are torn or are stretched beyond their limits.  Rest and return to your normal activities as told by your health care provider. If directed, apply heat and ice to the affected area as often as told by your health care provider.  Take over-the-counter and prescription medicines only as told by your health care provider.  Contact a health care provider if you have new or worsening symptoms. This information is not intended to replace advice given to you by your health care provider. Make sure you discuss any questions you have with your health care provider. Document Revised: 05/31/2018 Document Reviewed: 05/31/2018 Elsevier Patient Education  2020 [employer]  

## 2020-03-31 NOTE — Progress Notes (Signed)
BP 118/80 (BP Location: Left Arm, Patient Position: Sitting, Cuff Size: Normal)   Pulse 82   Temp 99.2 F (37.3 C) (Oral)   Ht 6\' 6"  (1.981 m)   Wt 287 lb (130.2 kg)   SpO2 98%   BMI 33.17 kg/m    Subjective:    Patient ID: , male    DOB: 1980-08-20, 39 y.o.   MRN: [id number]  HPI: Brian Contreras is a 39 y.o. male presenting with back pain.  Chief Complaint  Patient presents with  . Back Pain    Threw back out over the weekend, requesting refill for Flexeril   BACK PAIN Patient reports he threw his back out over the weekend doing the yard work.  He reports he takes flexeril and sees a chiropractor and usually this is enough for his back pain. Duration: days Mechanism of injury: lifting Location: midline and low back Onset: sudden Severity: severe Quality: shooting Frequency: constant Radiation: none Aggravating factors: movement Alleviating factors: ice every 20 minutes, flexeril Status: stable Treatments attempted: ice, flexeril  Relief with NSAIDs?: No NSAIDs Taken Nighttime pain:  no Paresthesias / decreased sensation:  no Bowel / bladder incontinence:  no Fevers:  no Dysuria / urinary frequency:  no  ERECTILE DYSFUNCTION Patient reports for the past few months, he has been having issues with performance.  He has a desire to have sex with his wife but is struggling with getting an erection.  Has never had these types of struggles before.   Allergies  Allergen Reactions  . Amoxicillin Swelling  . Ceclor [Cefaclor] Other (See Comments)  . Penicillins Swelling  . Sulfa Antibiotics Other (See Comments)   Outpatient Encounter Medications as of 03/31/2020  Medication Sig  . albuterol (PROVENTIL HFA;VENTOLIN HFA) 108 (90 Base) MCG/ACT inhaler Inhale 2 puffs into the lungs every 6 (six) hours as needed for wheezing or shortness of breath.  . cyclobenzaprine (FLEXERIL) 10 MG tablet Take 1 tablet (10 mg total) by mouth 2 (two) times daily as  needed.  04/02/2020 lisinopril (PRINIVIL,ZESTRIL) 10 MG tablet Take 1 tablet (10 mg total) by mouth daily.  . meloxicam (MOBIC) 15 MG tablet   . SUMAtriptan (IMITREX) 100 MG tablet May repeat in 2 hours if headache persists or recurs.  . traMADol (ULTRAM) 50 MG tablet Take 1 tablet (50 mg total) by mouth every 12 (twelve) hours as needed for severe pain.  . [DISCONTINUED] cyclobenzaprine (FLEXERIL) 10 MG tablet Take 10 mg by mouth 2 (two) times daily as needed.  . [DISCONTINUED] traMADol (ULTRAM) 50 MG tablet Take 1 tablet (50 mg total) by mouth 2 (two) times daily as needed.  . predniSONE (DELTASONE) 10 MG tablet Take 6 tabs day one, 5 tabs day two, 4 tabs day three, etc (Patient not taking: Reported on 03/31/2020)  . sildenafil (VIAGRA) 50 MG tablet Take 1 tablet (50 mg total) by mouth daily as needed for erectile dysfunction.   No facility-administered encounter medications on file as of 03/31/2020.   Patient Active Problem List   Diagnosis Date Noted  . Erectile dysfunction 03/31/2020  . Acute midline low back pain without sciatica 03/31/2020  . Asthma 09/17/2018  . Class 2 severe obesity due to excess calories with serious comorbidity and body mass index (BMI) of 35.0 to 35.9 in adult (HCC) 08/23/2018  . Migraines 08/23/2018  . Essential hypertension 08/20/2018   Past Medical History:  Diagnosis Date  . Asthma   . Kidney stone   . Migraines  Relevant past medical, surgical, family and social history reviewed and updated as indicated. Interim medical history since our last visit reviewed.  Review of Systems  Genitourinary: Negative.  Negative for decreased urine volume, difficulty urinating, discharge, dysuria, frequency, penile pain, penile swelling, scrotal swelling and testicular pain.       + erectile dysfunction  Musculoskeletal: Positive for back pain. Negative for arthralgias, gait problem, myalgias, neck pain and neck stiffness.  Skin: Negative.  Negative for color change and  rash.  Neurological: Negative.  Negative for dizziness, weakness, light-headedness and headaches.  Psychiatric/Behavioral: Negative.  Negative for confusion and decreased concentration.   Per HPI unless specifically indicated above     Objective:    BP 118/80 (BP Location: Left Arm, Patient Position: Sitting, Cuff Size: Normal)   Pulse 82   Temp 99.2 F (37.3 C) (Oral)   Ht 6\' 6"  (1.981 m)   Wt 287 lb (130.2 kg)   SpO2 98%   BMI 33.17 kg/m   Wt Readings from Last 3 Encounters:  03/31/20 287 lb (130.2 kg)  07/01/19 280 lb (127 kg)  12/03/18 280 lb (127 kg)    Physical Exam Vitals and nursing note reviewed.  Constitutional:      Appearance: Normal appearance. He is obese.  HENT:     Head: Normocephalic and atraumatic.  Cardiovascular:     Rate and Rhythm: Normal rate and regular rhythm.     Heart sounds: Normal heart sounds. No murmur heard.   Pulmonary:     Effort: Pulmonary effort is normal. No respiratory distress.     Breath sounds: Normal breath sounds. No wheezing, rhonchi or rales.  Abdominal:     General: Abdomen is flat. Bowel sounds are normal. There is no distension.     Palpations: Abdomen is soft.     Tenderness: There is no abdominal tenderness.  Musculoskeletal:     Cervical back: Normal.     Thoracic back: Normal.     Lumbar back: Bony tenderness present. No swelling or edema. Decreased range of motion.     Right lower leg: No edema.     Left lower leg: No edema.  Skin:    General: Skin is warm and dry.     Coloration: Skin is not jaundiced or pale.  Neurological:     General: No focal deficit present.     Mental Status: He is alert and oriented to person, place, and time.     Motor: No weakness.     Gait: Gait normal.  Psychiatric:        Mood and Affect: Mood normal.        Behavior: Behavior normal.        Thought Content: Thought content normal.        Judgment: Judgment normal.        Assessment & Plan:   Problem List Items Addressed  This Visit      Other   Erectile dysfunction - Primary    Acute, ongoing.  Possibly due to increase in stress for the past few months.  Will start trial of sildenafil and monitor for effectiveness.  May consider further testing in future with HbA1c and PSA if problem persists.        Acute midline low back pain without sciatica    Acute, ongoing.  Likely lumbar strain due to heavy lifting over weekend.  PDMP reviewed and appropriate, will start flexeril to be used at night and tramadol 50 mg q12 hours as  needed for severe pain #10.  If pain persists, patient to return to clinic.      Relevant Medications   cyclobenzaprine (FLEXERIL) 10 MG tablet   traMADol (ULTRAM) 50 MG tablet       Follow up plan: Return if symptoms worsen or fail to improve.

## 2020-04-10 ENCOUNTER — Ambulatory Visit: Payer: Self-pay

## 2020-04-10 NOTE — Telephone Encounter (Signed)
  Pt. Reports he was exposed to COVID 19 Tuesday and now has symptoms. Headache, chills, fatigue, body aches. No availability for virtual visit today per [place].[facility] will go to [facility]. Answer Assessment - Initial Assessment Questions 1. COVID-19 DIAGNOSIS: "Who made your Coronavirus (COVID-19) diagnosis?" "Was it confirmed by a positive lab test?" If not diagnosed by a HCP, ask "Are there lots of cases (community spread) where you live?" (See public health department website, if unsure)     Not tested yet 2. COVID-19 EXPOSURE: "Was there any known exposure to COVID before the symptoms began?" CDC Definition of close contact: within 6 feet (2 meters) for a total of 15 minutes or more over a 24-hour period.      Yes 3. ONSET: "When did the COVID-19 symptoms start?"      Tuesday 4. WORST SYMPTOM: "What is your worst symptom?" (e.g., cough, fever, shortness of breath, muscle aches)     Achy, cough, chills, headache 5. COUGH: "Do you have a cough?" If Yes, ask: "How bad is the cough?"       Yes 6. FEVER: "Do you have a fever?" If Yes, ask: "What is your temperature, how was it measured, and when did it start?"     Unsure 7. RESPIRATORY STATUS: "Describe your breathing?" (e.g., shortness of breath, wheezing, unable to speak)      No 8. BETTER-SAME-WORSE: "Are you getting better, staying the same or getting worse compared to yesterday?"  If getting worse, ask, "In what way?"     Worse 9. HIGH RISK DISEASE: "Do you have any chronic medical problems?" (e.g., asthma, heart or lung disease, weak immune system, obesity, etc.)     Asthma 10. PREGNANCY: "Is there any chance you are pregnant?" "When was your last menstrual period?"       n/a 11. OTHER SYMPTOMS: "Do you have any other symptoms?"  (e.g., chills, fatigue, headache, loss of smell or taste, muscle pain, sore throat; new loss of smell or taste especially support the diagnosis of COVID-19)       See above  Protocols used: CORONAVIRUS (COVID-19)  DIAGNOSED OR SUSPECTED-A-AH

## 2020-07-10 ENCOUNTER — Other Ambulatory Visit: Payer: Self-pay | Admitting: Nurse Practitioner

## 2020-07-13 MED ORDER — CYCLOBENZAPRINE HCL 10 MG PO TABS: 10.0000 mg | ORAL_TABLET | Freq: Two times a day (BID) | ORAL | 0 refills | Status: DC | PRN

## 2020-07-13 NOTE — Telephone Encounter (Signed)
Just did   Thanks.

## 2020-07-13 NOTE — Telephone Encounter (Signed)
Routing to provider Can these be refilled?

## 2020-07-13 NOTE — Telephone Encounter (Signed)
Called and left a message letting him know that the flexeril was called in and that if needs the tramadol he will need an appt.

## 2020-07-13 NOTE — Telephone Encounter (Signed)
Can refill the cyclobenzaprine, not the tramadol.  If he wants a refill of the tramadol, needs an appointment.

## 2020-07-13 NOTE — Telephone Encounter (Signed)
Left a VM for patient, can you send the flexeril over please

## 2020-08-12 ENCOUNTER — Other Ambulatory Visit: Payer: Self-pay

## 2020-08-12 ENCOUNTER — Ambulatory Visit (INDEPENDENT_AMBULATORY_CARE_PROVIDER_SITE_OTHER): Payer: PRIVATE HEALTH INSURANCE | Admitting: Family Medicine

## 2020-08-12 ENCOUNTER — Encounter: Payer: Self-pay | Admitting: Family Medicine

## 2020-08-12 VITALS — BP 127/88 | HR 79 | Temp 98.5°F | Ht 75.83 in | Wt 283.5 lb

## 2020-08-12 DIAGNOSIS — J4540 Moderate persistent asthma, uncomplicated: Secondary | ICD-10-CM

## 2020-08-12 DIAGNOSIS — I10 Essential (primary) hypertension: Secondary | ICD-10-CM

## 2020-08-12 DIAGNOSIS — Z6835 Body mass index (BMI) 35.0-35.9, adult: Secondary | ICD-10-CM

## 2020-08-12 DIAGNOSIS — G43009 Migraine without aura, not intractable, without status migrainosus: Secondary | ICD-10-CM

## 2020-08-12 DIAGNOSIS — M545 Low back pain, unspecified: Secondary | ICD-10-CM

## 2020-08-12 DIAGNOSIS — Z1159 Encounter for screening for other viral diseases: Secondary | ICD-10-CM

## 2020-08-12 DIAGNOSIS — Z0000 Encounter for general adult medical examination without abnormal findings: Secondary | ICD-10-CM | POA: Diagnosis not present

## 2020-08-12 DIAGNOSIS — N529 Male erectile dysfunction, unspecified: Secondary | ICD-10-CM

## 2020-08-12 LAB — BAYER DCA HB A1C WAIVED: HB A1C (BAYER DCA - WAIVED): 5.6 % (ref <7.0)

## 2020-08-12 MED ORDER — TRAMADOL HCL 50 MG PO TABS: 50.0000 mg | ORAL_TABLET | Freq: Two times a day (BID) | ORAL | 0 refills | Status: DC | PRN

## 2020-08-12 NOTE — Assessment & Plan Note (Signed)
Doing well on current regimen, no changes made today. 

## 2020-08-12 NOTE — Patient Instructions (Signed)
It was great to see you!  Our plans for today:  - See below for diet recommendations.  - We are checking some labs today, we will release these results to your MyChart.  Things to do to keep yourself healthy  - Exercise at least 30-45 minutes a day, 3-4 days a week.  - Eat a low-fat diet with lots of fruits and vegetables, up to 7-9 servings per day.  - Seatbelts can save your life. Wear them always.  - Smoke detectors on every level of your home, check batteries every year.  - Eye Doctor - have an eye exam every 1-2 years  - Safe sex - if you may be exposed to STDs, use a condom.  - Alcohol -  If you drink, do it moderately, less than 2 drinks per day.  - Health Care Power of Attorney. Choose someone to speak for you if you are not able. https://www.prepareforyourcare.org is a great website to help you navigate this. - Depression is common in our stressful world.If you're feeling down or losing interest in things you normally enjoy, please come in for a visit.  - Violence - If anyone is threatening or hurting you, please call immediately.  Take care and seek immediate care sooner if you develop any concerns.   Dr. Linwood Dibbles  Here is an example of what a healthy plate looks like:    ? Make half your plate fruits and vegetables.     ? Focus on whole fruits.     ? Vary your veggies.  ? Make half your grains whole grains. -     ? Look for the word "whole" at the beginning of the ingredients list    ? Some whole-grain ingredients include whole oats, whole-wheat flour,        whole-grain corn, whole-grain brown rice, and whole rye.  ? Move to low-fat and fat-free milk or yogurt.  ? Vary your protein routine. - Meat, fish, poultry (chicken, [place]), eggs, beans (kidney, pinto), dairy.  ? Drink and eat less sodium, saturated fat, and added sugars.

## 2020-08-12 NOTE — Assessment & Plan Note (Signed)
Recommend continued efforts with weight loss through diet/exercise.
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
## 2020-08-12 NOTE — Assessment & Plan Note (Signed)
Recently established with ortho. Appropriate tramadol use, PDMP reviewed. Refill provided. Encouraged continued weight loss through diet/exercise.  F/u in 6 months.

## 2020-08-12 NOTE — Assessment & Plan Note (Signed)
At goal off of antihypertensives. Will not restart at this time. Obtaining labs today.

## 2020-08-13 LAB — BASIC METABOLIC PANEL
BUN/Creatinine Ratio: 20 (ref 9–20)
BUN: 20 mg/dL (ref 6–20)
CO2: 19 mmol/L — ABNORMAL LOW (ref 20–29)
Calcium: 9.7 mg/dL (ref 8.7–10.2)
Chloride: 106 mmol/L (ref 96–106)
Creatinine, Ser: 1.01 mg/dL (ref 0.76–1.27)
GFR calc Af Amer: 109 mL/min/{1.73_m2} (ref >59)
GFR calc non Af Amer: 94 mL/min/{1.73_m2} (ref >59)
Glucose: 98 mg/dL (ref 65–99)
Potassium: 4.6 mmol/L (ref 3.5–5.2)
Sodium: 139 mmol/L (ref 134–144)

## 2020-08-13 LAB — LIPID PANEL
Chol/HDL Ratio: 5.3 ratio — ABNORMAL HIGH (ref 0.0–5.0)
Cholesterol, Total: 200 mg/dL — ABNORMAL HIGH (ref 100–199)
HDL: 38 mg/dL — ABNORMAL LOW (ref >39)
LDL Chol Calc (NIH): 144 mg/dL — ABNORMAL HIGH (ref 0–99)
Triglycerides: 96 mg/dL (ref 0–149)
VLDL Cholesterol Cal: 18 mg/dL (ref 5–40)

## 2020-08-13 LAB — HEPATITIS C ANTIBODY: Hep C Virus Ab: <0.1 s/co ratio (ref 0.0–0.9)

## 2021-06-23 ENCOUNTER — Ambulatory Visit: Payer: PRIVATE HEALTH INSURANCE | Admitting: Nurse Practitioner

## 2022-07-01 ENCOUNTER — Ambulatory Visit (INDEPENDENT_AMBULATORY_CARE_PROVIDER_SITE_OTHER): Payer: No Typology Code available for payment source | Admitting: Physician Assistant

## 2022-07-01 ENCOUNTER — Encounter: Payer: Self-pay | Admitting: Physician Assistant

## 2022-07-01 VITALS — BP 124/88 | HR 111 | Temp 98.2°F | Ht 78.0 in | Wt 295.0 lb

## 2022-07-01 DIAGNOSIS — Z0000 Encounter for general adult medical examination without abnormal findings: Secondary | ICD-10-CM | POA: Diagnosis not present

## 2022-07-01 DIAGNOSIS — N529 Male erectile dysfunction, unspecified: Secondary | ICD-10-CM | POA: Diagnosis not present

## 2022-07-01 MED ORDER — SILDENAFIL CITRATE 50 MG PO TABS: 50.0000 mg | ORAL_TABLET | Freq: Every day | ORAL | 4 refills | Status: AC | PRN

## 2022-07-01 NOTE — Progress Notes (Signed)
Annual Physical Exam   Name: Brian Contreras   MRN: [id number]    DOB: 1981/05/16   Date:07/01/2022  Today's Provider: Talitha Givens, MHS, PA-C Introduced myself to the patient as a PA-C and provided education on APPs in clinical practice.         Subjective  Chief Complaint  Chief Complaint  Patient presents with   Annual Exam    Patient here for physical today, would like refills on medications today.     HPI  Patient presents for annual CPE. Would like refills for Sildenafil   Diet: It trying to eat more lean protein, does not eat fruits or vegetables, tries to eat less processed foods and less fatty foods.   Exercise: He exercises intermittently but reports difficulty with maintaining schedule due to work  Sleep: Reports sleep is "pretty good", getting about 7-9 hours per night, reports his job has variable work hours so it is hard to maintain schedule  Mood:"decent"   Depression: phq 9 is negative    07/01/2022    1:56 PM 03/31/2020    4:28 PM 08/20/2018    2:41 PM  Depression screen PHQ 2/9  Decreased Interest 0 0 0  Down, Depressed, Hopeless 0 0 0  PHQ - 2 Score 0 0 0  Altered sleeping 0 0   Tired, decreased energy 1 0   Change in appetite 0 0   Feeling bad or failure about yourself  0 0   Trouble concentrating 0 0   Moving slowly or fidgety/restless 0 0   Suicidal thoughts 0 0   PHQ-9 Score 1 0   Difficult doing work/chores Not difficult at all Not difficult at all     Hypertension:  BP Readings from Last 3 Encounters:  07/01/22 124/88  08/12/20 127/88  03/31/20 118/80    Obesity: Wt Readings from Last 3 Encounters:  07/01/22 295 lb (133.8 kg)  08/12/20 283 lb 8 oz (128.6 kg)  03/31/20 287 lb (130.2 kg)   BMI Readings from Last 3 Encounters:  07/01/22 34.09 kg/m  08/12/20 34.67 kg/m  03/31/20 33.17 kg/m     Lipids:  Lab Results  Component Value Date   CHOL 200 (H) 08/12/2020   CHOL 203 (H) 09/17/2018   Lab Results  Component  Value Date   HDL 38 (L) 08/12/2020   HDL 36 (L) 09/17/2018   Lab Results  Component Value Date   LDLCALC 144 (H) 08/12/2020   LDLCALC 146 (H) 09/17/2018   Lab Results  Component Value Date   TRIG 96 08/12/2020   TRIG 105 09/17/2018   Lab Results  Component Value Date   CHOLHDL 5.3 (H) 08/12/2020   No results found for: "LDLDIRECT" Glucose:  Glucose  Date Value Ref Range Status  08/12/2020 98 65 - 99 mg/dL Final  09/17/2018 102 (H) 65 - 99 mg/dL Final  11/27/2014 100 (H) mg/dL Final    Comment:    65-99 NOTE: New Reference Range  10/21/14   02/18/2014 113 (H) 65 - 99 mg/dL Final   Glucose, Bld  Date Value Ref Range Status  07/01/2019 181 (H) 70 - 99 mg/dL Final  06/29/2017 98 65 - 99 mg/dL Final  07/16/2016 83 65 - 99 mg/dL Final      Married STD testing and prevention (HIV/chl/gon/syphilis):  no Hep C Screening: Completed  Skin cancer: Discussed monitoring for atypical lesions Colorectal cancer: Denies hx of colon cancer in family to his knowledge  Denies family hx of breast cancer in family to his knowledge  Prostate cancer:  not applicable No results found for: "PSA"   Lung cancer:  Low Dose CT Chest recommended if Age 50-80 years, 30 pack-year currently smoking OR have quit w/in 15years. Patient  not applicable AAA: The USPSTF recommends one-time screening with ultrasonography in men ages 51 to 12 years who have ever smoked. Patient:  not applicable ECG:  NA  Vaccines:   HPV: Aged out  Tdap: Up to date, next due in 2030 Shingrix: NA Pneumonia: NA Flu: Declined flu shot today. Reports he can get it at work if he changes his mind  COVID-19:Discussed importance of vaccines in preventing illness and reducing severity of disease   Advanced Care Planning: A voluntary discussion about advance care planning including the explanation and discussion of advance directives.  Discussed health care proxy and Living will, and the patient was able to identify a  health care proxy as no one.  Patient does not have a living will in effect.   Patient Active Problem List   Diagnosis Date Noted   Annual physical exam 07/01/2022   Erectile dysfunction 03/31/2020   Acute midline low back pain without sciatica 03/31/2020   Asthma 09/17/2018   Class 2 severe obesity due to excess calories with serious comorbidity and body mass index (BMI) of 35.0 to 35.9 in adult [facility]) 08/23/2018   Migraines 08/23/2018   Essential hypertension 08/20/2018    Past Surgical History:  Procedure Laterality Date   ADENOIDECTOMY     arm surgery Bilateral    TONSILLECTOMY     TYMPANOSTOMY TUBE PLACEMENT     WISDOM TOOTH EXTRACTION      Family History  Problem Relation Age of Onset   Arthritis Mother    Diabetes Mother    Kidney disease Mother    Migraines Mother    Arthritis Father    Arthritis Brother    Torticollis Daughter    Kidney disease Daughter        Reflux   Diabetes Maternal Grandmother    Hypertension Maternal Grandmother    Heart attack Maternal Grandfather    Cancer Paternal Grandmother    Hypertension Paternal Grandfather    Hyperlipidemia Paternal Grandfather     Social History   Socioeconomic History   Marital status: Married    Spouse name: Not on file   Number of children: Not on file   Years of education: Not on file   Highest education level: Not on file  Occupational History   Not on file  Tobacco Use   Smoking status: Never   Smokeless tobacco: Never  Vaping Use   Vaping Use: Never used  Substance and Sexual Activity   Alcohol use: Yes    Comment: occasional   Drug use: No   Sexual activity: Yes    Birth control/protection: I.U.D.  Other Topics Concern   Not on file  Social History Narrative   Not on file   Social Determinants of Health   Financial Resource Strain: Not on file  Food Insecurity: Not on file  Transportation Needs: Not on file  Physical Activity: Not on file  Stress: Not on file  Social  Connections: Not on file  Intimate Partner Violence: Not on file     Current Outpatient Medications:    albuterol (PROVENTIL HFA;VENTOLIN HFA) 108 (90 Base) MCG/ACT inhaler, Inhale 2 puffs into the lungs every 6 (six) hours as needed for wheezing or shortness of breath., Disp:  1 Inhaler, Rfl: 11   cyclobenzaprine (FLEXERIL) 10 MG tablet, Take 1 tablet (10 mg total) by mouth 2 (two) times daily as needed., Disp: 30 tablet, Rfl: 0   SUMAtriptan (IMITREX) 100 MG tablet, May repeat in 2 hours if headache persists or recurs., Disp: 10 tablet, Rfl: 2   traMADol (ULTRAM) 50 MG tablet, Take 1 tablet (50 mg total) by mouth every 12 (twelve) hours as needed for severe pain., Disp: 10 tablet, Rfl: 0   sildenafil (VIAGRA) 50 MG tablet, Take 1 tablet (50 mg total) by mouth daily as needed for erectile dysfunction., Disp: 10 tablet, Rfl: 4  Allergies  Allergen Reactions   Amoxicillin Swelling   Ceclor [Cefaclor] Other (See Comments)   Penicillins Swelling   Sulfa Antibiotics Other (See Comments)     Review of Systems  Constitutional:  Negative for chills, fever, malaise/fatigue and weight loss.  HENT:  Positive for hearing loss (Chronic and longstanding but not evolving). Negative for congestion, sore throat and tinnitus.   Eyes:  Negative for blurred vision, double vision and photophobia.  Respiratory:  Negative for cough, shortness of breath and wheezing.   Cardiovascular:  Negative for chest pain, palpitations, claudication and leg swelling.  Gastrointestinal:  Positive for heartburn (occasional). Negative for blood in stool, constipation, diarrhea, nausea and vomiting.  Genitourinary:  Negative for dysuria, frequency and urgency.  Musculoskeletal:  Negative for falls, joint pain and myalgias.  Skin:  Negative for itching and rash.  Neurological:  Positive for headaches (hx of migraines). Negative for dizziness, tingling, tremors, loss of consciousness and weakness.  Psychiatric/Behavioral:   Negative for depression, hallucinations, memory loss, substance abuse and suicidal ideas. The patient is not nervous/anxious and does not have insomnia.        Objective  Vitals:   07/01/22 1350  BP: 124/88  Pulse: (!) 111  Temp: 98.2 F (36.8 C)  SpO2: 98%  Weight: 295 lb (133.8 kg)  Height: _0  (1.981 m)    Body mass index is 34.09 kg/m.  Physical Exam Vitals reviewed.  Constitutional:      General: He is awake.     Appearance: Normal appearance. He is well-developed, well-groomed and overweight.  HENT:     Head: Normocephalic and atraumatic.     Right Ear: Hearing, tympanic membrane, ear canal and external ear normal.     Left Ear: Hearing, tympanic membrane, ear canal and external ear normal.     Mouth/Throat:     Lips: Pink.     Pharynx: Oropharynx is clear. Uvula midline. No pharyngeal swelling, posterior oropharyngeal erythema or uvula swelling.     Tonsils: No tonsillar exudate or tonsillar abscesses.  Eyes:     General: Lids are normal. Gaze aligned appropriately.     Extraocular Movements: Extraocular movements intact.     Conjunctiva/sclera: Conjunctivae normal.     Pupils: Pupils are equal, round, and reactive to light.  Cardiovascular:     Rate and Rhythm: Normal rate and regular rhythm.     Pulses: Normal pulses.          Radial pulses are 2+ on the right side and 2+ on the left side.     Heart sounds: Normal heart sounds. No murmur heard.    No friction rub. No gallop.  Pulmonary:     Effort: Pulmonary effort is normal.     Breath sounds: Normal breath sounds. No decreased air movement. No decreased breath sounds, wheezing, rhonchi or rales.  Abdominal:  General: Abdomen is flat. Bowel sounds are normal.     Palpations: Abdomen is soft.     Tenderness: There is no abdominal tenderness.  Musculoskeletal:     Cervical back: Normal range of motion and neck supple.     Right lower leg: No edema.     Left lower leg: No edema.  Lymphadenopathy:      Head:     Right side of head: No submental, submandibular or preauricular adenopathy.     Left side of head: No submental, submandibular or preauricular adenopathy.     Cervical:     Right cervical: No superficial or posterior cervical adenopathy.    Left cervical: No superficial or posterior cervical adenopathy.     Upper Body:     Right upper body: No supraclavicular adenopathy.     Left upper body: No supraclavicular adenopathy.  Neurological:     General: No focal deficit present.     Mental Status: He is alert and oriented to person, place, and time.     GCS: GCS eye subscore is 4. GCS verbal subscore is 5. GCS motor subscore is 6.     Cranial Nerves: Cranial nerves 2-12 are intact. No dysarthria or facial asymmetry.     Motor: Motor function is intact.     Coordination: Coordination is intact.     Gait: Gait is intact.     Deep Tendon Reflexes:     Reflex Scores:      Patellar reflexes are 2+ on the right side and 2+ on the left side. Psychiatric:        Attention and Perception: Attention and perception normal.        Mood and Affect: Mood and affect normal.        Speech: Speech normal.        Behavior: Behavior normal. Behavior is cooperative.        Thought Content: Thought content normal.        Cognition and Memory: Cognition and memory normal.        Judgment: Judgment normal.      No results found for this or any previous visit (from the past 2160 hour(s)).   Fall Risk:    07/01/2022    1:56 PM 09/17/2018    8:02 AM 08/20/2018    2:40 PM  [place] in the past year? 0 0 0  Number falls in past yr: 0 0 0  Injury with Fall? 0 0 0  Risk for fall due to : No Fall Risks    Follow up Falls evaluation completed       Functional Status Survey: Is the patient deaf or have difficulty hearing?: No Does the patient have difficulty seeing, even when wearing glasses/contacts?: No Does the patient have difficulty concentrating, remembering, or making  decisions?: No Does the patient have difficulty walking or climbing stairs?: No Does the patient have difficulty dressing or bathing?: No Does the patient have difficulty doing errands alone such as visiting a doctor's office or shopping?: No    Assessment & Plan  Problem List Items Addressed This Visit       Other   Erectile dysfunction    Chronic, ongoing Appears to be well managed with Sildenafil but reports he is sometimes needing 2 tablets for effect Refills provided today Recommend seeing PCP for discussion of medication changes as needed  Follow up in 6 months       Relevant Medications  sildenafil (VIAGRA) 50 MG tablet   Annual physical exam - Primary    -Prostate cancer screening and PSA options (with potential risks and benefits of testing vs not testing) were discussed along with recent recs/guidelines. -USPSTF grade A and B recommendations reviewed with patient; age-appropriate recommendations, preventive care, screening tests, etc discussed and encouraged; healthy living encouraged; see AVS for patient education given to patient -Discussed importance of 150 minutes of physical activity weekly, eat two servings of fish weekly, eat one serving of tree nuts ( cashews, pistachios, pecans, almonds.Marland Kitchen) every other day, eat 6 servings of fruit/vegetables daily and drink plenty of water and avoid sweet beverages.  -Reviewed Health Maintenance: yes      Relevant Orders   Comp Met (CMET)   CBC w/Diff   Lipid Profile   HgB A1c   TSH     Return in about 6 months (around 12/30/2022) for ED, establish with Santiago Glad.   I, Danita Proud E Tinnie Kunin, PA-C, have reviewed all documentation for this visit. The documentation on 07/01/22 for the exam, diagnosis, procedures, and orders are all accurate and complete.   Talitha Givens, MHS, PA-C [facility]

## 2022-07-01 NOTE — Assessment & Plan Note (Signed)
Chronic, ongoing Appears to be well managed with Sildenafil but reports he is sometimes needing 2 tablets for effect Refills provided today Recommend seeing PCP for discussion of medication changes as needed  Follow up in 6 months

## 2022-07-01 NOTE — Assessment & Plan Note (Signed)
-  Prostate cancer screening and PSA options (with potential risks and benefits of testing vs not testing) were discussed along with recent recs/guidelines. -USPSTF grade A and B recommendations reviewed with patient; age-appropriate recommendations, preventive care, screening tests, etc discussed and encouraged; healthy living encouraged; see AVS for patient education given to patient -Discussed importance of 150 minutes of physical activity weekly, eat two servings of fish weekly, eat one serving of tree nuts ( cashews, pistachios, pecans, almonds..) every other day, eat 6 servings of fruit/vegetables daily and drink plenty of water and avoid sweet beverages.  -Reviewed Health Maintenance: yes 

## 2023-01-02 ENCOUNTER — Ambulatory Visit: Payer: No Typology Code available for payment source | Admitting: Nurse Practitioner

## 2023-01-02 DIAGNOSIS — E782 Mixed hyperlipidemia: Secondary | ICD-10-CM | POA: Insufficient documentation

## 2023-01-02 NOTE — Progress Notes (Deleted)
   There were no vitals taken for this visit.   Subjective:    Patient ID: Brian Contreras, male    DOB: March 25, 1981, 42 y.o.   MRN: [id number]  HPI: Brian Contreras is a 42 y.o. male  No chief complaint on file.  HYPERTENSION / HYPERLIPIDEMIA Satisfied with current treatment? {Blank single:19197::"yes","no"} Duration of hypertension: {Blank single:19197::"chronic","months","years"} BP monitoring frequency: {Blank single:19197::"not checking","rarely","daily","weekly","monthly","a few times a day","a few times a week","a few times a month"} BP range:  BP medication side effects: {Blank single:19197::"yes","no"} Past BP meds: {Blank multiple:19196::"none","amlodipine","amlodipine/benazepril","atenolol","benazepril","benazepril/HCTZ","bisoprolol (bystolic)","carvedilol","chlorthalidone","clonidine","diltiazem","exforge HCT","HCTZ","irbesartan (avapro)","labetalol","lisinopril","lisinopril-HCTZ","losartan (cozaar)","methyldopa","nifedipine","olmesartan (benicar)","olmesartan-HCTZ","quinapril","ramipril","spironalactone","tekturna","valsartan","valsartan-HCTZ","verapamil"} Duration of hyperlipidemia: {Blank single:19197::"chronic","months","years"} Cholesterol medication side effects: {Blank single:19197::"yes","no"} Cholesterol supplements: {Blank multiple:19196::"none","fish oil","niacin","red yeast rice"} Past cholesterol medications: {Blank multiple:19196::"none","atorvastain (lipitor)","lovastatin (mevacor)","pravastatin (pravachol)","rosuvastatin (crestor)","simvastatin (zocor)","vytorin","fenofibrate (tricor)","gemfibrozil","ezetimide (zetia)","niaspan","lovaza"} Medication compliance: {Blank single:19197::"excellent compliance","good compliance","fair compliance","poor compliance"} Aspirin: {Blank single:19197::"yes","no"} Recent stressors: {Blank single:19197::"yes","no"} Recurrent headaches: {Blank single:19197::"yes","no"} Visual changes: {Blank  single:19197::"yes","no"} Palpitations: {Blank single:19197::"yes","no"} Dyspnea: {Blank single:19197::"yes","no"} Chest pain: {Blank single:19197::"yes","no"} Lower extremity edema: {Blank single:19197::"yes","no"} Dizzy/lightheaded: {Blank single:19197::"yes","no"}  Relevant past medical, surgical, family and social history reviewed and updated as indicated. Interim medical history since our last visit reviewed. Allergies and medications reviewed and updated.  Review of Systems  Per HPI unless specifically indicated above     Objective:    There were no vitals taken for this visit.  Wt Readings from Last 3 Encounters:  07/01/22 295 lb (133.8 kg)  08/12/20 283 lb 8 oz (128.6 kg)  03/31/20 287 lb (130.2 kg)    Physical Exam  Results for orders placed or performed in visit on 08/12/20  Basic Metabolic Panel (BMET)  Result Value Ref Range   Glucose 98 65 - 99 mg/dL   BUN 20 6 - 20 mg/dL   Creatinine, Ser 2.72 0.76 - 1.27 mg/dL   GFR calc non Af Amer 94 >59 mL/min/1.73   GFR calc Af Amer 109 >59 mL/min/1.73   BUN/Creatinine Ratio 20 9 - 20   Sodium 139 134 - 144 mmol/L   Potassium 4.6 3.5 - 5.2 mmol/L   Chloride 106 96 - 106 mmol/L   CO2 19 (L) 20 - 29 mmol/L   Calcium 9.7 8.7 - 10.2 mg/dL  Bayer DCA Hb Z3G Waived (STAT)  Result Value Ref Range   HB A1C (BAYER DCA - WAIVED) 5.6 <7.0 %  Lipid panel  Result Value Ref Range   Cholesterol, Total 200 (H) 100 - 199 mg/dL   Triglycerides 96 0 - 149 mg/dL   HDL 38 (L) >64 mg/dL   VLDL Cholesterol Cal 18 5 - 40 mg/dL   LDL Chol Calc (NIH) 403 (H) 0 - 99 mg/dL   Chol/HDL Ratio 5.3 (H) 0.0 - 5.0 ratio  Hepatitis C antibody  Result Value Ref Range   Hep C Virus Ab <0.1 0.0 - 0.9 s/co ratio      Assessment & Plan:   Problem List Items Addressed This Visit   None    Follow up plan: No follow-ups on file.

## 2023-05-10 ENCOUNTER — Other Ambulatory Visit: Payer: Self-pay | Admitting: Family Medicine

## 2023-05-12 ENCOUNTER — Encounter: Payer: Self-pay | Admitting: Physician Assistant

## 2023-05-12 ENCOUNTER — Telehealth (INDEPENDENT_AMBULATORY_CARE_PROVIDER_SITE_OTHER): Payer: 59 | Admitting: Physician Assistant

## 2023-05-12 DIAGNOSIS — M5459 Other low back pain: Secondary | ICD-10-CM | POA: Diagnosis not present

## 2023-05-12 DIAGNOSIS — M545 Low back pain, unspecified: Secondary | ICD-10-CM

## 2023-05-12 MED ORDER — TRAMADOL HCL 50 MG PO TABS: 50.0000 mg | ORAL_TABLET | Freq: Two times a day (BID) | ORAL | 0 refills | Status: AC | PRN

## 2023-05-12 MED ORDER — CYCLOBENZAPRINE HCL 10 MG PO TABS: 10.0000 mg | ORAL_TABLET | Freq: Two times a day (BID) | ORAL | 0 refills | Status: AC | PRN

## 2023-05-12 NOTE — Assessment & Plan Note (Signed)
Chronic with recurrent exacerbations Appears exacerbated at this time He has been trying home measures without much relief Reviewed visit notes from [facility] from 11/2021 - "He has an MRI of the lumbar spine from 07/30/2021 which demonstrates facet arthropathy and disc degenerative changes most notable at L4-5 and L5-S1 with central protrusion and annular fissure." Ortho recommended tramadol, muscle relaxers - he was offered intralaminar ESI but declined and was given Toradol injection Will send in script for limited supply of Tramadol 50 mg PO BID PRN and refill of Cyclobenzaprine 10 mg PO BID PRN If symptoms are not improving recommend follow up in office or with Ortho  Follow up as needed for persistent or progressing symptoms

## 2023-05-12 NOTE — Progress Notes (Signed)
Virtual Visit via Video Note  I connected with Brian Contreras on 05/12/23 at  1:20 PM EDT by a video enabled telemedicine application and verified that I am speaking with the correct person using two identifiers.  Today's Provider: Jacquelin Hawking, MHS, PA-C Introduced myself to the patient as a PA-C and provided education on APPs in clinical practice.   Location: Patient: at home  Provider: [facility], [place], [place]    I discussed the limitations of evaluation and management by telemedicine and the availability of in person appointments. The patient expressed understanding and agreed to proceed.   Chief Complaint  Patient presents with   Back Pain    Patient says he has had Back Pain. Patient says he has been trying to Flexeril that he was prescribed in the past for the back pain and it helps a little, but it has gradually gotten worse lately and he would like to discuss with provider about getting his Tramadol prescription refilled. Patient says he was wear a back brace to help relieve the pain, but once he takes it off, the pain and discomfort would return.    History of Present Illness:    Back Pain  He reports that he "pulled" his back the other day-  He is not wearing as much gear since changing jobs- states this has helped reduce flare frequency significantly- thanks   Interventions: he reports he has been using flexeril, Inversion table, back brace, warm compresses and cool compresses to try to provide relief but with flares he usually needs tramadol to get out of flare States he tries to minimize use to PRN    He was seeing Roland Earl - reviewed last visit notes from 12/03/21  He states he was told to just treat flares for now by specialty  He has tried PT in the past - reports he has found most relief from losing weight 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  Neurological:  Positive for  tingling. Negative for loss of consciousness and weakness.    Observations/Objective:  Due to the nature of the virtual visit, physical exam and observations are limited. Able to obtain the following observations:   Alert, oriented, x3 Appears comfortable, in no acute distress.  No scleral injection, no appreciated hoarseness, tachypnea, wheeze or strider. Able to maintain conversation without visible strain.  Neck movement appears intact  No cough appreciated during visit.    Assessment and Plan:  Problem List Items Addressed This Visit       Other   Acute midline low back pain without sciatica - Primary    Chronic with recurrent exacerbations Appears exacerbated at this time He has been trying home measures without much relief Reviewed visit notes from [facility] from 11/2021 - "He has an MRI of the lumbar spine from 07/30/2021 which demonstrates facet arthropathy and disc degenerative changes most notable at L4-5 and L5-S1 with central protrusion and annular fissure." Ortho recommended tramadol, muscle relaxers - he was offered intralaminar ESI but declined and was given Toradol injection Will send in script for limited supply of Tramadol 50 mg PO BID PRN and refill of Cyclobenzaprine 10 mg PO BID PRN If symptoms are not improving recommend follow up in office or with Ortho  Follow up as needed for persistent or progressing symptoms        Relevant Medications   traMADol (ULTRAM) 50 MG  tablet   cyclobenzaprine (FLEXERIL) 10 MG tablet   Follow Up Instructions:    I discussed the assessment and treatment plan with the patient. The patient was provided an opportunity to ask questions and all were answered. The patient agreed with the plan and demonstrated an understanding of the instructions.   The patient was advised to call back or seek an in-person evaluation if the symptoms worsen or if the condition fails to improve as anticipated.  I provided 12 minutes of  non-face-to-face time during this encounter.   No follow-ups on file.   I, Lycan Davee E Shardea Cwynar, PA-C, have reviewed all documentation for this visit. The documentation on 05/12/23 for the exam, diagnosis, procedures, and orders are all accurate and complete.   Jacquelin Hawking, MHS, PA-C [facility]

## 2023-05-31 ENCOUNTER — Encounter (HOSPITAL_COMMUNITY): Payer: Self-pay

## 2023-05-31 ENCOUNTER — Other Ambulatory Visit: Payer: Self-pay

## 2023-05-31 ENCOUNTER — Emergency Department (HOSPITAL_COMMUNITY)
Admission: EM | Admit: 2023-05-31 | Discharge: 2023-06-01 | Disposition: A | Discharge status: Home / Self Care | Payer: 59 | Attending: Emergency Medicine | Admitting: Emergency Medicine

## 2023-05-31 DIAGNOSIS — I10 Essential (primary) hypertension: Secondary | ICD-10-CM | POA: Insufficient documentation

## 2023-05-31 DIAGNOSIS — A084 Viral intestinal infection, unspecified: Secondary | ICD-10-CM | POA: Diagnosis not present

## 2023-05-31 DIAGNOSIS — R109 Unspecified abdominal pain: Secondary | ICD-10-CM | POA: Diagnosis present

## 2023-05-31 DIAGNOSIS — R112 Nausea with vomiting, unspecified: Secondary | ICD-10-CM

## 2023-05-31 DIAGNOSIS — R051 Acute cough: Secondary | ICD-10-CM

## 2023-05-31 DIAGNOSIS — J45909 Unspecified asthma, uncomplicated: Secondary | ICD-10-CM | POA: Diagnosis not present

## 2023-05-31 DIAGNOSIS — R7989 Other specified abnormal findings of blood chemistry: Secondary | ICD-10-CM | POA: Insufficient documentation

## 2023-05-31 LAB — COMPREHENSIVE METABOLIC PANEL
ALT: 57 U/L — ABNORMAL HIGH (ref 0–44)
AST: 45 U/L — ABNORMAL HIGH (ref 15–41)
Albumin: 3.6 g/dL (ref 3.5–5.0)
Alkaline Phosphatase: 43 U/L (ref 38–126)
Anion gap: 12 (ref 5–15)
BUN: 15 mg/dL (ref 6–20)
CO2: 20 mmol/L — ABNORMAL LOW (ref 22–32)
Calcium: 8.6 mg/dL — ABNORMAL LOW (ref 8.9–10.3)
Chloride: 109 mmol/L (ref 98–111)
Creatinine, Ser: 1.17 mg/dL (ref 0.61–1.24)
GFR, Estimated: >60 mL/min (ref >60)
Glucose, Bld: 87 mg/dL (ref 70–99)
Potassium: 3.8 mmol/L (ref 3.5–5.1)
Sodium: 141 mmol/L (ref 135–145)
Total Bilirubin: 0.8 mg/dL (ref 0.3–1.2)
Total Protein: 7 g/dL (ref 6.5–8.1)

## 2023-05-31 LAB — CBC
HCT: 46.5 % (ref 39.0–52.0)
Hemoglobin: 15.6 g/dL (ref 13.0–17.0)
MCH: 28.2 pg (ref 26.0–34.0)
MCHC: 33.5 g/dL (ref 30.0–36.0)
MCV: 83.9 fL (ref 80.0–100.0)
Platelets: 180 10*3/uL (ref 150–400)
RBC: 5.54 MIL/uL (ref 4.22–5.81)
RDW: 13.4 % (ref 11.5–15.5)
WBC: 6.2 10*3/uL (ref 4.0–10.5)
nRBC: 0 % (ref 0.0–0.2)

## 2023-05-31 LAB — LIPASE, BLOOD: Lipase: 34 U/L (ref 11–51)

## 2023-05-31 MED ORDER — ONDANSETRON HCL 4 MG/2ML IJ SOLN
4.0000 mg | Freq: Once | INTRAMUSCULAR | Status: AC
  Administered 2023-05-31: 4 mg via INTRAVENOUS
  Filled 2023-05-31: qty 2

## 2023-05-31 NOTE — ED Triage Notes (Signed)
Pt BIB [facility] from UC d/t n/v/d for the past 2 days, has not had much intake d/t his n/v, also feels weak & sore in his chest from coughing as well. UC started a 20g in Rt Hand & he has received about 500 cc NS & EMS gave 4 mg Zofran. 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 A/Ox4, 150/100, 102 bpm, 97% on RA, CBG 97, 12 L NSR.

## 2023-05-31 NOTE — ED Provider Notes (Incomplete)
[place] EMERGENCY DEPARTMENT AT [facility] Provider Note   CSN: [id number] Arrival date & time: 05/31/23  1655     History {Add pertinent medical, surgical, social history, OB history to HPI:1} Chief Complaint  Patient presents with  . Nausea  . Emesis  . Diarrhea  . Cough    Brian Contreras is a 42 y.o. male with history of hypertension, asthma, kidney stones, migraines, hyperlipidemia, who presents to the emergency department complaining of nausea, vomiting, and diarrhea for the past 2 days.  Patient states that his symptoms started after he was volunteering in [facility].  Has not been able to keep down food or liquid for the past several days.  Has only urinated once today.  Has had some intermittent abdominal cramping, localized to his left upper quadrant, and chest pressure with coughing, cough is nonproductive.  Has felt chills but no measured fever.  Has not had a normal bowel movement since symptoms started.  No blood in his stool.  Denies any contact with bad food or water that he knows of.  Received antiemetics and fluids with EMS, with some improvement. Reports flu and COVID testing at [facility] was negative.    Emesis Associated symptoms: abdominal pain, chills, cough and diarrhea   Diarrhea Associated symptoms: abdominal pain, chills and vomiting   Cough Associated symptoms: chills        Home Medications Prior to Admission medications   Medication Sig Start Date End Date Taking? Authorizing Provider  albuterol (PROVENTIL HFA;VENTOLIN HFA) 108 (90 Base) MCG/ACT inhaler Inhale 2 puffs into the lungs every 6 (six) hours as needed for wheezing or shortness of breath. 09/17/18   Particia Nearing, PA-C  cyclobenzaprine (FLEXERIL) 10 MG tablet Take 1 tablet (10 mg total) by mouth 2 (two) times daily as needed. 05/12/23   Mecum, Erin E, PA-C  sildenafil (VIAGRA) 50 MG tablet Take 1 tablet (50 mg total) by mouth daily as needed for erectile  dysfunction. 07/01/22   Mecum, Oswaldo Conroy, PA-C  SUMAtriptan (IMITREX) 100 MG tablet May repeat in 2 hours if headache persists or recurs. 08/20/18   Particia Nearing, PA-C  traMADol (ULTRAM) 50 MG tablet Take 1 tablet (50 mg total) by mouth every 12 (twelve) hours as needed for severe pain. 05/12/23   Mecum, Erin E, PA-C      Allergies    Amoxicillin, Ceclor [cefaclor], Penicillins, and Sulfa antibiotics    Review of Systems   Review of Systems  Constitutional:  Positive for chills.  Respiratory:  Positive for cough.   Gastrointestinal:  Positive for abdominal pain, diarrhea and vomiting.  Genitourinary:  Positive for decreased urine volume.  All other systems reviewed and are negative.   Physical Exam Updated Vital Signs BP (!) 132/97 (BP Location: Right Arm)   Pulse (!) 110   Temp 98.6 F (37 C)   Resp 18   Ht 6\' 6"  (1.981 m)   Wt 129.3 kg   SpO2 96%   BMI 32.94 kg/m  Physical Exam Vitals and nursing note reviewed.  Constitutional:      Appearance: Normal appearance.     Comments: Actively retching/vomiting  HENT:     Head: Normocephalic and atraumatic.  Eyes:     Conjunctiva/sclera: Conjunctivae normal.  Cardiovascular:     Rate and Rhythm: Normal rate and regular rhythm.  Pulmonary:     Effort: Pulmonary effort is normal. No respiratory distress.     Breath sounds: Normal breath sounds.  Abdominal:     General: There is no distension.     Palpations: Abdomen is soft.     Tenderness: There is abdominal tenderness in the left upper quadrant. There is no guarding.  Skin:    General: Skin is warm and dry.  Neurological:     General: No focal deficit present.     Mental Status: He is alert.     ED Results / Procedures / Treatments   Labs (all labs ordered are listed, but only abnormal results are displayed) Labs Reviewed  COMPREHENSIVE METABOLIC PANEL - Abnormal; Notable for the following components:      Result Value   CO2 20 (*)    Calcium 8.6 (*)     AST 45 (*)    ALT 57 (*)    All other components within normal limits  LIPASE, BLOOD  CBC  URINALYSIS, ROUTINE W REFLEX MICROSCOPIC    EKG None  Radiology No results found.  Procedures Procedures  {Document cardiac monitor, telemetry assessment procedure when appropriate:1}  Medications Ordered in ED Medications  ondansetron (ZOFRAN) injection 4 mg (4 mg Intravenous Given 05/31/23 1743)    ED Course/ Medical Decision Making/ A&P   {   Click here for ABCD2, HEART and other calculatorsREFRESH Note before signing :1}                              Medical Decision Making Amount and/or Complexity of Data Reviewed Labs: ordered.   This patient is a 42 y.o. male  who presents to the ED for concern of abdominal pain, vomiting, and diarrhea.   Differential diagnoses prior to evaluation: The emergent differential diagnosis includes, but is not limited to,  ACS/MI, Boerhaave's, DKA, elevated ICP, Ischemic bowel, Sepsis, Drug-related (toxicity, THC hyperemesis, ETOH, withdrawal), Appendicitis, Bowel obstruction, Electrolyte abnormalities, Pancreatitis, Biliary colic, Gastroenteritis, Gastroparesis, Hepatitis, Migraine, Thyroid disease, Renal colic, GERD/PUD, UTI. This is not an exhaustive differential.   Past Medical History / Co-morbidities / Social History: ***  Additional history: Chart reviewed. Pertinent results include: ***  Physical Exam: Physical exam performed. The pertinent findings include: ***  Lab Tests/Imaging studies: I personally interpreted labs/imaging and the pertinent results include:  ***. ***I agree with the radiologist interpretation.  Cardiac monitoring: EKG obtained and interpreted by myself and attending physician which shows: ***   Medications: I ordered medication including ***.  I have reviewed the patients home medicines and have made adjustments as needed.   Disposition: After consideration of the diagnostic results and the patients response  to treatment, I feel that *** .   ***emergency department workup does not suggest an emergent condition requiring admission or immediate intervention beyond what has been performed at this time. The plan is: ***. The patient is safe for discharge and has been instructed to return immediately for worsening symptoms, change in symptoms or any other concerns.  Final Clinical Impression(s) / ED Diagnoses Final diagnoses:  None    Rx / DC Orders ED Discharge Orders     None      Portions of this report may have been transcribed using voice recognition software. Every effort was made to ensure accuracy; however, inadvertent computerized transcription errors may be present.

## 2023-05-31 NOTE — ED Provider Triage Note (Signed)
Emergency Medicine Provider Triage Evaluation Note  Brian Contreras , a 42 y.o. male  was evaluated in triage.  Pt complains of nausea and vomiting.  Patient reports has had several days of nausea and vomiting after he was volunteering in [facility].  States that he has been able to keep any food or liquid down for several days.  States he has not been able to urinate at all today.  Denies any significant abdominal pain but does report some abdominal cramping when the emergency department comes up.  He also Dors is some chest pressure with coughing but denies any chest pain or pressure while at rest.  No shortness of breath.  Some subjective fever/chills over the last few days but nothing measured.  Review of Systems  Positive: As above Negative: As above  Physical Exam  BP 126/75   Pulse 97   Temp 98.9 F (37.2 C) (Oral)   Resp 17   Ht 6\' 6"  (1.981 m)   Wt 129.3 kg   SpO2 100%   BMI 32.94 kg/m  Gen:   Awake, no distress   Resp:  Normal effort  MSK:   Moves extremities without difficulty  Other:  Mild generalized abdominal tenderness  Medical Decision Making  Medically screening exam initiated at 5:32 PM.  Appropriate orders placed.  Brian Contreras was informed that the remainder of the evaluation will be completed by another provider, this initial triage assessment does not replace that evaluation, and the importance of remaining in the ED until their evaluation is complete.     Brian Knudsen, PA-C 05/31/23 1734

## 2023-05-31 NOTE — ED Provider Notes (Signed)
[place] EMERGENCY DEPARTMENT AT [facility] Provider Note   CSN: [id number] Arrival date & time: 05/31/23  1655     History  Chief Complaint  Patient presents with   Nausea   Emesis   Diarrhea   Cough    Brian Contreras is a 42 y.o. male with history of hypertension, asthma, kidney stones, migraines, hyperlipidemia, who presents to the emergency department complaining of nausea, vomiting, and diarrhea for the past 2 days.  Patient states that his symptoms started after he was volunteering in [facility].  Has not been able to keep down food or liquid for the past several days.  Has only urinated once today.  Has had some intermittent abdominal cramping, localized to his left upper quadrant, and chest pressure with coughing, cough is nonproductive.  Has felt chills but no measured fever.  Has not had a normal bowel movement since symptoms started.  No blood in his stool.  Denies any contact with bad food or water that he knows of.  Received antiemetics and fluids with EMS, with some improvement. Reports flu and COVID testing at [facility] was negative.    Emesis Associated symptoms: abdominal pain, chills, cough and diarrhea   Diarrhea Associated symptoms: abdominal pain, chills and vomiting   Cough Associated symptoms: chills        Home Medications Prior to Admission medications   Medication Sig Start Date End Date Taking? Authorizing Provider  benzonatate (TESSALON) 100 MG capsule Take 1 capsule (100 mg total) by mouth every 8 (eight) hours. 06/01/23  Yes Demoni Gergen T, PA-C  metoCLOPramide (REGLAN) 10 MG tablet Take 1 tablet (10 mg total) by mouth every 6 (six) hours. 06/01/23  Yes Lilliemae Fruge T, PA-C  albuterol (PROVENTIL HFA;VENTOLIN HFA) 108 (90 Base) MCG/ACT inhaler Inhale 2 puffs into the lungs every 6 (six) hours as needed for wheezing or shortness of breath. 09/17/18   Particia Nearing, PA-C  cyclobenzaprine (FLEXERIL) 10 MG tablet  Take 1 tablet (10 mg total) by mouth 2 (two) times daily as needed. 05/12/23   Mecum, Erin E, PA-C  sildenafil (VIAGRA) 50 MG tablet Take 1 tablet (50 mg total) by mouth daily as needed for erectile dysfunction. 07/01/22   Mecum, Oswaldo Conroy, PA-C  SUMAtriptan (IMITREX) 100 MG tablet May repeat in 2 hours if headache persists or recurs. 08/20/18   Particia Nearing, PA-C  traMADol (ULTRAM) 50 MG tablet Take 1 tablet (50 mg total) by mouth every 12 (twelve) hours as needed for severe pain. 05/12/23   Mecum, Erin E, PA-C      Allergies    Amoxicillin, Ceclor [cefaclor], Penicillins, and Sulfa antibiotics    Review of Systems   Review of Systems  Constitutional:  Positive for chills.  Respiratory:  Positive for cough.   Gastrointestinal:  Positive for abdominal pain, diarrhea and vomiting.  Genitourinary:  Positive for decreased urine volume.  All other systems reviewed and are negative.   Physical Exam Updated Vital Signs BP 112/72   Pulse 85   Temp 98.9 F (37.2 C) (Oral)   Resp 16   Ht 6\' 6"  (1.981 m)   Wt 129.3 kg   SpO2 98%   BMI 32.94 kg/m  Physical Exam Vitals and nursing note reviewed.  Constitutional:      Appearance: He is ill-appearing.     Comments: Actively retching/vomiting  HENT:     Head: Normocephalic and atraumatic.  Eyes:     Conjunctiva/sclera: Conjunctivae normal.  Cardiovascular:     Rate and Rhythm: Normal rate and regular rhythm.  Pulmonary:     Effort: Pulmonary effort is normal. No respiratory distress.     Breath sounds: Normal breath sounds.  Abdominal:     General: There is no distension.     Palpations: Abdomen is soft.     Tenderness: There is abdominal tenderness in the left upper quadrant. There is no guarding.  Skin:    General: Skin is warm and moist.  Neurological:     General: No focal deficit present.     Mental Status: He is alert.     ED Results / Procedures / Treatments   Labs (all labs ordered are listed, but only abnormal  results are displayed) Labs Reviewed  COMPREHENSIVE METABOLIC PANEL - Abnormal; Notable for the following components:      Result Value   CO2 20 (*)    Calcium 8.6 (*)    AST 45 (*)    ALT 57 (*)    All other components within normal limits  URINALYSIS, ROUTINE W REFLEX MICROSCOPIC - Abnormal; Notable for the following components:   Color, Urine AMBER (*)    APPearance HAZY (*)    Ketones, ur 20 (*)    All other components within normal limits  LIPASE, BLOOD  CBC    EKG None  Radiology CT ABDOMEN PELVIS W CONTRAST  Result Date: 06/01/2023 CLINICAL DATA:  42 year old male with history of diarrhea and left upper quadrant abdominal pain. EXAM: CT ABDOMEN AND PELVIS WITH CONTRAST TECHNIQUE: Multidetector CT imaging of the abdomen and pelvis was performed using the standard protocol following bolus administration of intravenous contrast. RADIATION DOSE REDUCTION: This exam was performed according to the departmental dose-optimization program which includes automated exposure control, adjustment of the mA and/or kV according to patient size and/or use of iterative reconstruction technique. CONTRAST:  75mL OMNIPAQUE IOHEXOL 350 MG/ML SOLN COMPARISON:  None Available. FINDINGS: Lower chest: Unremarkable. Hepatobiliary: No suspicious cystic or solid hepatic lesions. No intra or extrahepatic biliary ductal dilatation. Gallbladder is unremarkable in appearance. Pancreas: No pancreatic mass. No pancreatic ductal dilatation. No pancreatic or peripancreatic fluid collections or inflammatory changes. Spleen: Unremarkable. Adrenals/Urinary Tract: Bilateral kidneys and adrenal glands are normal in appearance. No hydroureteronephrosis. Urinary bladder is normal in appearance. Stomach/Bowel: The appearance of the stomach is normal. No pathologic dilatation of small bowel or colon. A few scattered colonic diverticula are noted, without surrounding inflammatory changes to indicate an acute diverticulitis at this  time. Normal appendix. Vascular/Lymphatic: No significant atherosclerotic disease, aneurysm or dissection noted in the abdominal or pelvic vasculature. No lymphadenopathy noted in the abdomen or pelvis. Reproductive: Prostate gland and seminal vesicles are unremarkable in appearance. Other: No significant volume of ascites.  No pneumoperitoneum. Musculoskeletal: There are no aggressive appearing lytic or blastic lesions noted in the visualized portions of the skeleton. IMPRESSION: 1. No acute findings are noted in the abdomen or pelvis to account for the patient's symptoms. 2. Mild colonic diverticulosis without evidence of acute diverticulitis at this time. Electronically Signed   By: Trudie Reed M.D.   On: 06/01/2023 05:31    Procedures Procedures    Medications Ordered in ED Medications  ondansetron (ZOFRAN) injection 4 mg (4 mg Intravenous Given 05/31/23 1743)  lactated ringers bolus 1,000 mL (0 mLs Intravenous Stopped 06/01/23 0242)  promethazine (PHENERGAN) 12.5 mg in sodium chloride 0.9 % 50 mL IVPB (0 mg Intravenous Stopped 06/01/23 0204)  iohexol (OMNIPAQUE) 350 MG/ML injection 75 mL (  75 mLs Intravenous Contrast Given 06/01/23 0153)  morphine (PF) 4 MG/ML injection 4 mg (4 mg Intravenous Given 06/01/23 0353)  metoCLOPramide (REGLAN) injection 10 mg (10 mg Intravenous Given 06/01/23 0354)    ED Course/ Medical Decision Making/ A&P                                 Medical Decision Making Amount and/or Complexity of Data Reviewed Labs: ordered.   This patient is a 42 y.o. male  who presents to the ED for concern of abdominal pain, vomiting, and diarrhea.   Differential diagnoses prior to evaluation: The emergent differential diagnosis includes, but is not limited to,  ACS/MI, Boerhaave's, DKA, elevated ICP, Ischemic bowel, Sepsis, Drug-related (toxicity, THC hyperemesis, ETOH, withdrawal), Appendicitis, Bowel obstruction, Electrolyte abnormalities, Pancreatitis, Biliary colic,  Gastroenteritis, Gastroparesis, Hepatitis, Migraine, Thyroid disease, Renal colic, GERD/PUD, UTI. This is not an exhaustive differential.   Past Medical History / Co-morbidities / Social History: Hypertension, asthma, kidney stones, migraines, hyperlipidemia No hx of abdominal surgeries  Physical Exam: Physical exam performed. The pertinent findings include: Patient ill-appearing, vomiting during examination.  Left upper quadrant tenderness to palpation without guarding.  Tachycardic and hypertensive, afebrile.  Lab Tests/Imaging studies: I personally interpreted labs/imaging and the pertinent results include: No leukocytosis, normal hemoglobin.  CMP with mildly elevated LFTs including AST 45, ALT 57.  Normal lipase.  Urinalysis unremarkable.  CT abdomen pelvis with no acute findings.  I agree with the radiologist interpretation.  Cardiac monitoring: EKG obtained and interpreted by myself and attending physician which shows: NSR, QTC 450   Medications: Zofran ordered in triage.  I ordered medication including IVF, Phenergan, morphine, Reglan.  I have reviewed the patients home medicines and have made adjustments as needed.   Disposition: After consideration of the diagnostic results and the patients response to treatment, I feel that emergency department workup does not suggest an emergent condition requiring admission or immediate intervention beyond what has been performed at this time. The plan is: Discharge to home with symptomatic management of vomiting and diarrhea, likely viral in origin.  Laboratory workup and imaging reassuring today.  Patient had symptom improvement after Reglan.  Will send prescription of this for home.  He is also requesting a cough suppressant, will send Tessalon Perles. The patient is safe for discharge and has been instructed to return immediately for worsening symptoms, change in symptoms or any other concerns.  Final Clinical Impression(s) / ED  Diagnoses Final diagnoses:  Nausea vomiting and diarrhea  Viral enteritis  Acute cough    Rx / DC Orders ED Discharge Orders          Ordered    metoCLOPramide (REGLAN) 10 MG tablet  Every 6 hours        06/01/23 0549    benzonatate (TESSALON) 100 MG capsule  Every 8 hours        06/01/23 0549           Portions of this report may have been transcribed using voice recognition software. Every effort was made to ensure accuracy; however, inadvertent computerized transcription errors may be present.    Jeanella Flattery 06/01/23 4098    Dione Booze, MD 06/01/23 [phone]

## 2023-06-01 ENCOUNTER — Emergency Department (HOSPITAL_COMMUNITY): Payer: 59

## 2023-06-01 LAB — URINALYSIS, ROUTINE W REFLEX MICROSCOPIC
Bilirubin Urine: NEGATIVE
Glucose, UA: NEGATIVE mg/dL
Hgb urine dipstick: NEGATIVE
Ketones, ur: 20 mg/dL — AB
Leukocytes,Ua: NEGATIVE
Nitrite: NEGATIVE
Protein, ur: NEGATIVE mg/dL
Specific Gravity, Urine: 1.024 (ref 1.005–1.030)
pH: 5 (ref 5.0–8.0)

## 2023-06-01 MED ORDER — LACTATED RINGERS IV BOLUS
1000.0000 mL | Freq: Once | INTRAVENOUS | Status: AC
  Administered 2023-06-01: 1000 mL via INTRAVENOUS

## 2023-06-01 MED ORDER — IOHEXOL 350 MG/ML SOLN
75.0000 mL | Freq: Once | INTRAVENOUS | Status: AC | PRN
  Administered 2023-06-01: 75 mL via INTRAVENOUS

## 2023-06-01 MED ORDER — BENZONATATE 100 MG PO CAPS: 100.0000 mg | ORAL_CAPSULE | Freq: Three times a day (TID) | ORAL | 0 refills | Status: AC

## 2023-06-01 MED ORDER — METOCLOPRAMIDE HCL 5 MG/ML IJ SOLN
10.0000 mg | Freq: Once | INTRAMUSCULAR | Status: AC
  Administered 2023-06-01: 10 mg via INTRAVENOUS
  Filled 2023-06-01: qty 2

## 2023-06-01 MED ORDER — SODIUM CHLORIDE 0.9 % IV SOLN
12.5000 mg | Freq: Once | INTRAVENOUS | Status: AC
  Administered 2023-06-01: 12.5 mg via INTRAVENOUS
  Filled 2023-06-01: qty 12.5

## 2023-06-01 MED ORDER — METOCLOPRAMIDE HCL 10 MG PO TABS: 10.0000 mg | ORAL_TABLET | Freq: Four times a day (QID) | ORAL | 0 refills | Status: AC

## 2023-06-01 MED ORDER — MORPHINE SULFATE (PF) 4 MG/ML IV SOLN
4.0000 mg | Freq: Once | INTRAVENOUS | Status: AC
  Administered 2023-06-01: 4 mg via INTRAVENOUS
  Filled 2023-06-01: qty 1

## 2023-06-01 NOTE — Discharge Instructions (Addendum)
You were seen in the emergency department for vomiting and diarrhea.  As we discussed your blood work and CT scan were reassuring.  I am glad that you feel better after the medications given in the ER.  I suspect that you likely had a stomach virus.  I have prescribed medications for nausea as well as your cough.  Continue to monitor how you're doing and return to the ER for new or worsening symptoms.

## 2024-01-11 ENCOUNTER — Other Ambulatory Visit: Payer: Self-pay | Admitting: Nurse Practitioner

## 2024-01-11 DIAGNOSIS — N529 Male erectile dysfunction, unspecified: Secondary | ICD-10-CM

## 2024-01-11 NOTE — Telephone Encounter (Signed)
 Copied from CRM [id number]. Topic: Clinical - Medication Refill >> Jan 11, 2024 12:04 PM Fonda T wrote: Medication: sildenafil  (VIAGRA ) 50 MG tablet  Has the patient contacted their pharmacy? Yes, pharmacy Kim, calling for patient (Agent: If no, request that the patient contact the pharmacy for the refill. If patient does not wish to contact the pharmacy document the reason why and proceed with request.) (Agent: If yes, when and what did the pharmacy advise?)  This is the patient's preferred pharmacy:  [id number] [place] - [address] [place] [address] Phone: [phone] Fax: [phone]  Is this the correct pharmacy for this prescription? Yes If no, delete pharmacy and type the correct one.   Has the prescription been filled recently? Yes  Is the patient out of the medication? Yes  Has the patient been seen for an appointment in the last year OR does the patient have an upcoming appointment? Yes  Can we respond through MyChart? Yes  Agent: Please be advised that Rx refills may take up to 3 business days. We ask that you follow-up with your pharmacy.

## 2024-01-12 NOTE — Telephone Encounter (Signed)
 Requested medication (s) are due for refill today: Yes  Requested medication (s) are on the active medication list: Yes  Last refill:  07/01/22  Future visit scheduled: No  Notes to clinic:  Unable to refill per protocol, appointment needed      Requested Prescriptions  Pending Prescriptions Disp Refills   sildenafil  (VIAGRA ) 50 MG tablet 10 tablet 4    Sig: Take 1 tablet (50 mg total) by mouth daily as needed for erectile dysfunction.     Urology: Erectile Dysfunction Agents Failed - 01/12/2024 10:26 PM      Failed - AST in normal range and within 360 days    AST  Date Value Ref Range Status  05/31/2023 45 (H) 15 - 41 U/L Final   SGOT(AST)  Date Value Ref Range Status  11/27/2014 30 U/L Final    Comment:    15-41 NOTE: New Reference Range  10/21/14          Failed - ALT in normal range and within 360 days    ALT  Date Value Ref Range Status  05/31/2023 57 (H) 0 - 44 U/L Final   SGPT (ALT)  Date Value Ref Range Status  11/27/2014 39 U/L Final    Comment:    17-63 NOTE: New Reference Range  10/21/14          Failed - Valid encounter within last 12 months    Recent Outpatient Visits   None            Passed - Last BP in normal range    BP Readings from Last 1 Encounters:  06/01/23 112/72

## 2024-01-16 ENCOUNTER — Other Ambulatory Visit: Payer: Self-pay | Admitting: Nurse Practitioner

## 2024-01-16 DIAGNOSIS — N529 Male erectile dysfunction, unspecified: Secondary | ICD-10-CM

## 2024-01-16 NOTE — Telephone Encounter (Unsigned)
 Copied from CRM [id number]. Topic: Clinical - Medication Refill >> Jan 16, 2024 10:38 AM Loreda Rodriguez T wrote: Medication: sildenafil  (VIAGRA ) 50 MG tablet  Has the patient contacted their pharmacy? Yes  This is the patient's preferred pharmacy:  [facility] #[id number] [facility], [place] - [address] [place] [address] Phone: [phone] Fax: [phone]  Is this the correct pharmacy for this prescription? Yes  Has the prescription been filled recently? Yes  Is the patient out of the medication? Yes  Has the patient been seen for an appointment in the last year OR does the patient have an upcoming appointment? Yes  Can we respond through MyChart? Yes  Agent: Please be advised that Rx refills may take up to 3 business days. We ask that you follow-up with your pharmacy.

## 2024-01-17 NOTE — Telephone Encounter (Signed)
 Rx refused by office- needs appointment Requested Prescriptions  Pending Prescriptions Disp Refills   sildenafil  (VIAGRA ) 50 MG tablet 10 tablet 4    Sig: Take 1 tablet (50 mg total) by mouth daily as needed for erectile dysfunction.     Urology: Erectile Dysfunction Agents Failed - 01/17/2024 12:57 PM      Failed - AST in normal range and within 360 days    AST  Date Value Ref Range Status  05/31/2023 45 (H) 15 - 41 U/L Final   SGOT(AST)  Date Value Ref Range Status  11/27/2014 30 U/L Final    Comment:    15-41 NOTE: New Reference Range  10/21/14          Failed - ALT in normal range and within 360 days    ALT  Date Value Ref Range Status  05/31/2023 57 (H) 0 - 44 U/L Final   SGPT (ALT)  Date Value Ref Range Status  11/27/2014 39 U/L Final    Comment:    17-63 NOTE: New Reference Range  10/21/14          Failed - Valid encounter within last 12 months    Recent Outpatient Visits   None            Passed - Last BP in normal range    BP Readings from Last 1 Encounters:  06/01/23 112/72
